# Patient Record
Sex: Female | Born: 1995 | Race: Black or African American | Hispanic: No | Marital: Single | Smoking: Current every day smoker
Health system: Southern US, Community
[De-identification: ages and names within clinical notes are randomized; demographics above are authoritative.]

## PROBLEM LIST (undated history)

## (undated) ENCOUNTER — Inpatient Hospital Stay (HOSPITAL_COMMUNITY): Payer: Self-pay

## (undated) DIAGNOSIS — Z789 Other specified health status: Secondary | ICD-10-CM

## (undated) DIAGNOSIS — J45909 Unspecified asthma, uncomplicated: Secondary | ICD-10-CM

## (undated) HISTORY — PX: NO PAST SURGERIES: SHX2092

---
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
## 2017-01-25 ENCOUNTER — Encounter: Payer: Self-pay | Admitting: Certified Nurse Midwife

## 2017-01-25 ENCOUNTER — Other Ambulatory Visit (HOSPITAL_COMMUNITY)
Admission: RE | Admit: 2017-01-25 | Discharge: 2017-01-25 | Disposition: A | Discharge status: Home / Self Care | Payer: Medicaid Other | Source: Ambulatory Visit | Attending: Certified Nurse Midwife | Admitting: Certified Nurse Midwife

## 2017-01-25 ENCOUNTER — Ambulatory Visit (INDEPENDENT_AMBULATORY_CARE_PROVIDER_SITE_OTHER): Payer: Medicaid Other | Admitting: Certified Nurse Midwife

## 2017-01-25 VITALS — BP 91/55 | HR 98 | Wt 112.0 lb

## 2017-01-25 DIAGNOSIS — O0932 Supervision of pregnancy with insufficient antenatal care, second trimester: Secondary | ICD-10-CM

## 2017-01-25 DIAGNOSIS — Z3402 Encounter for supervision of normal first pregnancy, second trimester: Secondary | ICD-10-CM | POA: Diagnosis not present

## 2017-01-25 DIAGNOSIS — Z23 Encounter for immunization: Secondary | ICD-10-CM

## 2017-01-25 DIAGNOSIS — Z3400 Encounter for supervision of normal first pregnancy, unspecified trimester: Secondary | ICD-10-CM | POA: Diagnosis not present

## 2017-01-25 DIAGNOSIS — O0930 Supervision of pregnancy with insufficient antenatal care, unspecified trimester: Secondary | ICD-10-CM

## 2017-01-25 MED ORDER — PRENATE PIXIE 10-0.6-0.4-200 MG PO CAPS: 1.0000 | ORAL_CAPSULE | Freq: Every day | ORAL | 12 refills | Status: DC

## 2017-01-26 DIAGNOSIS — O0930 Supervision of pregnancy with insufficient antenatal care, unspecified trimester: Secondary | ICD-10-CM | POA: Insufficient documentation

## 2017-01-26 LAB — OBSTETRIC PANEL, INCLUDING HIV
Antibody Screen: NEGATIVE
BASOS ABS: 0 10*3/uL (ref 0.0–0.2)
Basos: 0 %
EOS (ABSOLUTE): 0.2 10*3/uL (ref 0.0–0.4)
Eos: 2 %
HEP B S AG: NEGATIVE
HIV SCREEN 4TH GENERATION: NONREACTIVE
Hematocrit: 33.1 % — ABNORMAL LOW (ref 34.0–46.6)
Hemoglobin: 11.1 g/dL (ref 11.1–15.9)
IMMATURE GRANS (ABS): 0 10*3/uL (ref 0.0–0.1)
IMMATURE GRANULOCYTES: 0 %
LYMPHS: 20 %
Lymphocytes Absolute: 1.5 10*3/uL (ref 0.7–3.1)
MCH: 31.2 pg (ref 26.6–33.0)
MCHC: 33.5 g/dL (ref 31.5–35.7)
MCV: 93 fL (ref 79–97)
MONOCYTES: 5 %
Monocytes Absolute: 0.4 10*3/uL (ref 0.1–0.9)
NEUTROS ABS: 5.3 10*3/uL (ref 1.4–7.0)
Neutrophils: 73 %
PLATELETS: 238 10*3/uL (ref 150–379)
RBC: 3.56 x10E6/uL — ABNORMAL LOW (ref 3.77–5.28)
RDW: 14.1 % (ref 12.3–15.4)
RPR: NONREACTIVE
RUBELLA: 1.04 {index} (ref >0.99)
Rh Factor: POSITIVE
WBC: 7.4 10*3/uL (ref 3.4–10.8)

## 2017-01-26 LAB — TSH PREGNANCY: TSH PREGNANCY: 0.372 u[IU]/mL — AB (ref 0.450–4.500)

## 2017-01-26 LAB — CERVICOVAGINAL ANCILLARY ONLY
BACTERIAL VAGINITIS: NEGATIVE
CANDIDA VAGINITIS: POSITIVE — AB
CHLAMYDIA, DNA PROBE: NEGATIVE
NEISSERIA GONORRHEA: NEGATIVE
TRICH (WINDOWPATH): NEGATIVE

## 2017-01-26 LAB — HEMOGLOBIN A1C
ESTIMATED AVERAGE GLUCOSE: 88 mg/dL
HEMOGLOBIN A1C: 4.7 % — AB (ref 4.8–5.6)

## 2017-01-26 LAB — VARICELLA ZOSTER ANTIBODY, IGG: Varicella zoster IgG: <135 index — ABNORMAL LOW (ref >165)

## 2017-01-26 NOTE — Progress Notes (Signed)
Subjective:    Teresa Johnson is being seen today for her first obstetrical visit.  This is not a planned pregnancy. She is at 2575w0d gestation. Her obstetrical history is significant for none. Relationship with FOB: significant other, living together. Patient does intend to breast feed. Pregnancy history fully reviewed.  Works at [employer] currently.    The information documented in the HPI was reviewed and verified.  Menstrual History: OB History    Gravida Para Term Preterm AB Living   1         0   SAB TAB Ectopic Multiple Live Births                   Patient's last menstrual period was 10/06/2016.    Past Medical History:  Diagnosis Date  . Medical history non-contributory     Past Surgical History:  Procedure Laterality Date  . NO PAST SURGERIES       (Not in a hospital admission) No Known Allergies  Social History  Substance Use Topics  . Smoking status: Never Smoker  . Smokeless tobacco: Never Used  . Alcohol use No    Family History  Problem Relation Age of Onset  . Diabetes Mother      Review of Systems Constitutional: negative for weight loss Gastrointestinal: negative for vomiting Genitourinary:negative for genital lesions and vaginal discharge and dysuria Musculoskeletal:negative for back pain Behavioral/Psych: negative for abusive relationship, depression, illegal drug usage and tobacco use    Objective:    BP (!) 91/55   Pulse 98   Wt 112 lb (50.8 kg)   LMP 10/06/2016   BMI 20.49 kg/m  General Appearance:    Alert, cooperative, no distress, appears stated age  Head:    Normocephalic, without obvious abnormality, atraumatic  Eyes:    PERRL, conjunctiva/corneas clear, EOM's intact, fundi    benign, both eyes  Ears:    Normal TM's and external ear canals, both ears  Nose:   Nares normal, septum midline, mucosa normal, no drainage    or sinus tenderness  Throat:   Lips, mucosa, and tongue normal; teeth and gums normal  Neck:   Supple,  symmetrical, trachea midline, no adenopathy;    thyroid:  no enlargement/tenderness/nodules; no carotid   bruit or JVD  Back:     Symmetric, no curvature, ROM normal, no CVA tenderness  Lungs:     Clear to auscultation bilaterally, respirations unlabored  Chest Wall:    No tenderness or deformity   Heart:    Regular rate and rhythm, S1 and S2 normal, no murmur, rub   or gallop  Breast Exam:    No tenderness, masses, or nipple abnormality  Abdomen:     Soft, non-tender, bowel sounds active all four quadrants,    no masses, no organomegaly  Genitalia:    Normal female without lesion, discharge or tenderness  Extremities:   Extremities normal, atraumatic, no cyanosis or edema  Pulses:   2+ and symmetric all extremities  Skin:   Skin color, texture, turgor normal, no rashes or lesions  Lymph nodes:   Cervical, supraclavicular, and axillary nodes normal  Neurologic:   CNII-XII intact, normal strength, sensation and reflexes    throughout       Cervix:  Long, thick, closed and posterior.  FHR:    By doppler.  FH: c/w early US.    Lab Review Urine pregnancy test Labs reviewed yes Radiologic studies reviewed yes  Assessment & Plan    Pregnancy  at [redacted]w[redacted]d weeks    1. Supervision of normal first pregnancy, antepartum    - [place] MFM OB COMP + 14 WK; Future - Varicella zoster antibody, IgG - Culture, OB Urine - MaterniT21 PLUS Core+SCA - Hemoglobin A1c - Obstetric Panel, Including HIV - TSH Pregnancy - Inheritest Society Guided - Prenat-FeAsp-Meth-FA-DHA w/o A (PRENATE PIXIE) 10-0.6-0.4-200 MG CAPS; Take 1 tablet by mouth daily.  Dispense: 30 capsule; Refill: 12 - Cervicovaginal ancillary only - AFP, Serum, Open Spina Bifida  2. Flu vaccine need    - Flu Vaccine QUAD 36+ mos IM (Fluarix, Quad PF)     Prenatal vitamins.  Counseling provided regarding continued use of seat belts, cessation of alcohol consumption, smoking or use of illicit drugs; infection precautions i.e.,  influenza/TDAP immunizations, toxoplasmosis,CMV, parvovirus, listeria and varicella; workplace safety, exercise during pregnancy; routine dental care, safe medications, sexual activity, hot tubs, saunas, pools, travel, caffeine use, fish and methlymercury, potential toxins, hair treatments, varicose veins Weight gain recommendations per IOM guidelines reviewed: underweight/BMI< 18.5--> gain 28 - 40 lbs; normal weight/BMI 18.5 - 24.9--> gain 25 - 35 lbs; overweight/BMI 25 - 29.9--> gain 15 - 25 lbs; obese/BMI >30->gain  11 - 20 lbs Problem list reviewed and updated. FIRST/CF mutation testing/NIPT/QUAD SCREEN/fragile X/Ashkenazi Jewish population testing/Spinal muscular atrophy discussed: ordered. Role of ultrasound in pregnancy discussed; fetal survey: ordered. Amniocentesis discussed: not indicated.   Meds ordered this encounter  Medications  . Prenat-FeAsp-Meth-FA-DHA w/o A (PRENATE PIXIE) 10-0.6-0.4-200 MG CAPS    Sig: Take 1 tablet by mouth daily.    Dispense:  30 capsule    Refill:  12    Please process coupon: Rx BIN: [id number], RxPCN: [id number], RxGRP: [id number], RxID: [id number]  SUF: 01   Orders Placed This Encounter  Procedures  . Culture, OB Urine  . [place] MFM OB COMP + 14 WK    Standing Status:   Future    Standing Expiration Date:   03/27/2018    Order Specific Question:   Reason for Exam (SYMPTOM  OR DIAGNOSIS REQUIRED)    Answer:   fetal anatomy scan    Order Specific Question:   Preferred imaging location?    Answer:   [facility]  . Flu Vaccine QUAD 36+ mos IM (Fluarix, Quad PF)  . Varicella zoster antibody, IgG  . MaterniT21 PLUS Core+SCA    Order Specific Question:   Is the patient insulin dependent?    Answer:   No    Order Specific Question:   Please enter gestational age. This should be expressed as weeks AND days, i.e. 16w 6d. Enter weeks here. Enter days in next question.    Answer:   44    Order Specific Question:   Please enter gestational age. This should be  expressed as weeks AND days, i.e. 16w 6d. Enter days here. Enter weeks in previous question.    Answer:   6    Order Specific Question:   How was gestational age calculated?    Answer:   LMP    Order Specific Question:   Please give the date of LMP OR Ultrasound OR Estimated date of delivery.    Answer:   07/13/2017    Order Specific Question:   Number of Fetuses (Type of Pregnancy):    Answer:   1    Order Specific Question:   Indications for performing the test? (please choose all that apply):    Answer:   Routine screening    Order Specific Question:   Other  Indications? (Y=Yes, N=No)    Answer:   N    Order Specific Question:   If this is a repeat specimen, please indicate the reason:    Answer:   Not indicated    Order Specific Question:   Please specify the patient's race: (C=White/Caucasion, B=Black, I=Native American, A=Asian, H=Hispanic, O=Other, U=Unknown)    Answer:   B    Order Specific Question:   Donor Egg - indicate if the egg was obtained from in vitro fertilization.    Answer:   N    Order Specific Question:   Age of Egg Donor.    Answer:   46    Order Specific Question:   Prior Down Syndrome/ONTD screening during current pregnancy.    Answer:   N    Order Specific Question:   Prior First Trimester Testing    Answer:   N    Order Specific Question:   Prior Second Trimester Testing    Answer:   N    Order Specific Question:   Family History of Neural Tube Defects    Answer:   N    Order Specific Question:   Prior Pregnancy with Down Syndrome    Answer:   N    Order Specific Question:   Please give the patient's weight (in pounds)    Answer:   112  . Hemoglobin A1c  . Obstetric Panel, Including HIV  . TSH Pregnancy  . Inheritest Society Guided  . AFP, Serum, Open Spina Bifida    Order Specific Question:   Is patient insulin dependent?    Answer:   No    Order Specific Question:   Weight (lbs)    Answer:   63    Order Specific Question:   Gestational Age (GA),  weeks    Answer:   15.6    Order Specific Question:   Date on which patient was at this GA    Answer:   01/25/2017    Order Specific Question:   GA Calculation Method    Answer:   Ultrasound    Order Specific Question:   GA Date    Answer:   07/13/2017    Order Specific Question:   Number of fetuses    Answer:   1    Order Specific Question:   Reason for screen    Answer:   PDOWNS    Order Specific Question:   Donor egg?    Answer:   N    Order Specific Question:   Age of egg donor?    Answer:   21    Follow up in 4 weeks. 50% of 30 min visit spent on counseling and coordination of care.

## 2017-01-27 LAB — CULTURE, OB URINE

## 2017-01-27 LAB — URINE CULTURE, OB REFLEX

## 2017-01-28 LAB — AFP, SERUM, OPEN SPINA BIFIDA
AFP MoM: 0.65
AFP Value: 27 ng/mL
Gest. Age on Collection Date: 15.6 weeks
Maternal Age At EDD: 20 yr
OSBR RISK 1 IN: 10000
TEST RESULTS AFP: NEGATIVE
WEIGHT: 112 [lb_av]

## 2017-01-29 LAB — MATERNIT21 PLUS CORE+SCA
CHROMOSOME 13: NEGATIVE
CHROMOSOME 18: NEGATIVE
CHROMOSOME 21: NEGATIVE
Y Chromosome: NOT DETECTED

## 2017-02-04 ENCOUNTER — Other Ambulatory Visit: Payer: Self-pay | Admitting: Certified Nurse Midwife

## 2017-02-04 DIAGNOSIS — B373 Candidiasis of vulva and vagina: Secondary | ICD-10-CM

## 2017-02-04 DIAGNOSIS — Z2839 Other underimmunization status: Secondary | ICD-10-CM

## 2017-02-04 DIAGNOSIS — O09899 Supervision of other high risk pregnancies, unspecified trimester: Secondary | ICD-10-CM

## 2017-02-04 DIAGNOSIS — Z283 Underimmunization status: Secondary | ICD-10-CM

## 2017-02-04 DIAGNOSIS — B3731 Acute candidiasis of vulva and vagina: Secondary | ICD-10-CM

## 2017-02-04 DIAGNOSIS — Z3400 Encounter for supervision of normal first pregnancy, unspecified trimester: Secondary | ICD-10-CM

## 2017-02-04 MED ORDER — TERCONAZOLE 0.8 % VA CREA: 1.0000 | TOPICAL_CREAM | Freq: Every day | VAGINAL | 0 refills | Status: DC

## 2017-02-04 MED ORDER — FLUCONAZOLE 150 MG PO TABS: 150.0000 mg | ORAL_TABLET | Freq: Once | ORAL | 0 refills | Status: AC

## 2017-02-06 LAB — INHERITEST SOCIETY GUIDED

## 2017-02-07 ENCOUNTER — Other Ambulatory Visit: Payer: Self-pay | Admitting: Certified Nurse Midwife

## 2017-02-07 DIAGNOSIS — Z3400 Encounter for supervision of normal first pregnancy, unspecified trimester: Secondary | ICD-10-CM

## 2017-02-22 ENCOUNTER — Ambulatory Visit (INDEPENDENT_AMBULATORY_CARE_PROVIDER_SITE_OTHER): Payer: Medicaid Other | Admitting: Certified Nurse Midwife

## 2017-02-22 ENCOUNTER — Encounter: Payer: Self-pay | Admitting: Certified Nurse Midwife

## 2017-02-22 ENCOUNTER — Ambulatory Visit (HOSPITAL_COMMUNITY)
Admission: RE | Admit: 2017-02-22 | Discharge: 2017-02-22 | Disposition: A | Discharge status: Home / Self Care | Payer: Medicaid Other | Source: Ambulatory Visit | Attending: Certified Nurse Midwife | Admitting: Certified Nurse Midwife

## 2017-02-22 ENCOUNTER — Ambulatory Visit (HOSPITAL_COMMUNITY): Payer: No Typology Code available for payment source

## 2017-02-22 VITALS — BP 101/67 | HR 86 | Wt 120.0 lb

## 2017-02-22 DIAGNOSIS — Z3402 Encounter for supervision of normal first pregnancy, second trimester: Secondary | ICD-10-CM

## 2017-02-22 DIAGNOSIS — Z3400 Encounter for supervision of normal first pregnancy, unspecified trimester: Secondary | ICD-10-CM

## 2017-02-22 DIAGNOSIS — Z3689 Encounter for other specified antenatal screening: Secondary | ICD-10-CM | POA: Insufficient documentation

## 2017-02-22 DIAGNOSIS — O09899 Supervision of other high risk pregnancies, unspecified trimester: Secondary | ICD-10-CM

## 2017-02-22 DIAGNOSIS — Z2839 Other underimmunization status: Secondary | ICD-10-CM

## 2017-02-22 DIAGNOSIS — Z283 Underimmunization status: Secondary | ICD-10-CM

## 2017-02-22 DIAGNOSIS — Z3A19 19 weeks gestation of pregnancy: Secondary | ICD-10-CM | POA: Diagnosis not present

## 2017-02-22 NOTE — Progress Notes (Signed)
Pt has had some cramping, sent her home from work x 1 occurrence.

## 2017-02-22 NOTE — Progress Notes (Signed)
   PRENATAL VISIT NOTE  Subjective:  Teresa Johnson is a 21 y.o. G1P0 at [redacted]w[redacted]d being seen today for ongoing prenatal care.  She is currently monitored for the following issues for this low-risk pregnancy and has Supervision of normal first pregnancy, antepartum; Late prenatal care; and Maternal varicella, non-immune on her problem list.  Patient reports no complaints.  Contractions: Irritability. Vag. Bleeding: None.  Movement: Present. Denies leaking of fluid.   The following portions of the patient's history were reviewed and updated as appropriate: allergies, current medications, past family history, past medical history, past social history, past surgical history and problem list. Problem list updated.  Objective:   Vitals:   02/22/17 1024  BP: 101/67  Pulse: 86  Weight: 120 lb (54.4 kg)    Fetal Status: Fetal Heart Rate (bpm): 142; doppler Fundal Height: 19 cm Movement: Present     General:  Alert, oriented and cooperative. Patient is in no acute distress.  Skin: Skin is warm and dry. No rash noted.   Cardiovascular: Normal heart rate noted  Respiratory: Normal respiratory effort, no problems with respiration noted  Abdomen: Soft, gravid, appropriate for gestational age.  Pain/Pressure: Absent     Pelvic: Cervical exam deferred        Extremities: Normal range of motion.     Mental Status:  Normal mood and affect. Normal behavior. Normal judgment and thought content.   Assessment and Plan:  Pregnancy: G1P0 at [redacted]w[redacted]d  1. Supervision of normal first pregnancy, antepartum      Doing well.   2. Maternal varicella, non-immune     Varicella postpartum.   Preterm labor symptoms and general obstetric precautions including but not limited to vaginal bleeding, contractions, leaking of fluid and fetal movement were reviewed in detail with the patient. Please refer to After Visit Summary for other counseling recommendations.  Return in about 4 weeks (around 03/22/2017) for  ROB.   Roe Coombs, CNM

## 2017-02-23 ENCOUNTER — Other Ambulatory Visit: Payer: Self-pay | Admitting: Certified Nurse Midwife

## 2017-03-22 ENCOUNTER — Other Ambulatory Visit (HOSPITAL_COMMUNITY)
Admission: RE | Admit: 2017-03-22 | Discharge: 2017-03-22 | Disposition: A | Discharge status: Home / Self Care | Payer: Medicaid Other | Source: Ambulatory Visit | Attending: Certified Nurse Midwife | Admitting: Certified Nurse Midwife

## 2017-03-22 ENCOUNTER — Ambulatory Visit (INDEPENDENT_AMBULATORY_CARE_PROVIDER_SITE_OTHER): Payer: Medicaid Other | Admitting: Certified Nurse Midwife

## 2017-03-22 ENCOUNTER — Encounter: Payer: Self-pay | Admitting: Certified Nurse Midwife

## 2017-03-22 VITALS — BP 90/57 | HR 95 | Wt 120.8 lb

## 2017-03-22 DIAGNOSIS — N898 Other specified noninflammatory disorders of vagina: Secondary | ICD-10-CM

## 2017-03-22 DIAGNOSIS — O219 Vomiting of pregnancy, unspecified: Secondary | ICD-10-CM

## 2017-03-22 DIAGNOSIS — Z2839 Other underimmunization status: Secondary | ICD-10-CM

## 2017-03-22 DIAGNOSIS — O23592 Infection of other part of genital tract in pregnancy, second trimester: Secondary | ICD-10-CM | POA: Insufficient documentation

## 2017-03-22 DIAGNOSIS — O09899 Supervision of other high risk pregnancies, unspecified trimester: Secondary | ICD-10-CM

## 2017-03-22 DIAGNOSIS — B3731 Acute candidiasis of vulva and vagina: Secondary | ICD-10-CM

## 2017-03-22 DIAGNOSIS — B373 Candidiasis of vulva and vagina: Secondary | ICD-10-CM

## 2017-03-22 DIAGNOSIS — Z3A23 23 weeks gestation of pregnancy: Secondary | ICD-10-CM | POA: Insufficient documentation

## 2017-03-22 DIAGNOSIS — Z283 Underimmunization status: Secondary | ICD-10-CM

## 2017-03-22 DIAGNOSIS — Z3400 Encounter for supervision of normal first pregnancy, unspecified trimester: Secondary | ICD-10-CM

## 2017-03-22 MED ORDER — TERCONAZOLE 0.8 % VA CREA: 1.0000 | TOPICAL_CREAM | Freq: Every day | VAGINAL | 0 refills | Status: DC

## 2017-03-22 MED ORDER — ONDANSETRON HCL 8 MG PO TABS: 8.0000 mg | ORAL_TABLET | Freq: Three times a day (TID) | ORAL | 2 refills | Status: DC | PRN

## 2017-03-22 MED ORDER — FLUCONAZOLE 150 MG PO TABS: 150.0000 mg | ORAL_TABLET | Freq: Once | ORAL | 0 refills | Status: AC

## 2017-03-22 NOTE — Progress Notes (Signed)
   PRENATAL VISIT NOTE  Subjective:  Teresa Johnson is a 21 y.o. G1P0 at 1243w6d being seen today for ongoing prenatal care.  She is currently monitored for the following issues for this low-risk pregnancy and has Supervision of normal first pregnancy, antepartum; Late prenatal care; and Maternal varicella, non-immune on her problem list.  Patient reports nausea, no bleeding, no contractions, no cramping, no leaking and vaginal irritation.  Contractions: Not present. Vag. Bleeding: None.  Movement: Present. Denies leaking of fluid.   The following portions of the patient's history were reviewed and updated as appropriate: allergies, current medications, past family history, past medical history, past social history, past surgical history and problem list. Problem list updated.  Objective:   Vitals:   03/22/17 0855  BP: (!) 90/57  Pulse: 95  Weight: 120 lb 12.8 oz (54.8 kg)    Fetal Status: Fetal Heart Rate (bpm): 148; doppler Fundal Height: 22 cm Movement: Present     General:  Alert, oriented and cooperative. Patient is in no acute distress.  Skin: Skin is warm and dry. No rash noted.   Cardiovascular: Normal heart rate noted  Respiratory: Normal respiratory effort, no problems with respiration noted  Abdomen: Soft, gravid, appropriate for gestational age.  Pain/Pressure: Absent     Pelvic: Cervical exam deferred        Extremities: Normal range of motion.  Edema: Trace  Mental Status:  Normal mood and affect. Normal behavior. Normal judgment and thought content.   Assessment and Plan:  Pregnancy: G1P0 at 6043w6d  1. Supervision of normal first pregnancy, antepartum     - Cervicovaginal ancillary only  2. Maternal varicella, non-immune     Varicella postpartum  3. Vaginal discharge      +yeast on exam - Cervicovaginal ancillary only  4. Yeast vaginitis     - fluconazole (DIFLUCAN) 150 MG tablet; Take 1 tablet (150 mg total) by mouth once.  Dispense: 1 tablet; Refill: 0 -  terconazole (TERAZOL 3) 0.8 % vaginal cream; Place 1 applicator vaginally at bedtime.  Dispense: 20 g; Refill: 0  5. Nausea/vomiting in pregnancy     - ondansetron (ZOFRAN) 8 MG tablet; Take 1 tablet (8 mg total) by mouth every 8 (eight) hours as needed for nausea or vomiting.  Dispense: 40 tablet; Refill: 2  Preterm labor symptoms and general obstetric precautions including but not limited to vaginal bleeding, contractions, leaking of fluid and fetal movement were reviewed in detail with the patient. Please refer to After Visit Summary for other counseling recommendations.  Return in about 4 weeks (around 04/19/2017) for ROB, 2 hr OGTT.   Roe Coombsachelle A Dinna Severs, CNM

## 2017-03-22 NOTE — Progress Notes (Signed)
Complains of NV, wants Rx.  Discharge, itching, no odor or burning.

## 2017-03-23 LAB — CERVICOVAGINAL ANCILLARY ONLY
BACTERIAL VAGINITIS: NEGATIVE
CHLAMYDIA, DNA PROBE: NEGATIVE
Candida vaginitis: POSITIVE — AB
NEISSERIA GONORRHEA: NEGATIVE
Trichomonas: NEGATIVE

## 2017-03-24 ENCOUNTER — Other Ambulatory Visit: Payer: Self-pay | Admitting: Certified Nurse Midwife

## 2017-03-27 ENCOUNTER — Telehealth: Payer: Self-pay | Admitting: *Deleted

## 2017-03-27 NOTE — Telephone Encounter (Signed)
Pt called to office stating she had a cramp in her leg last night and today could not walk on it once she woke up, stating it was "locked up". Pt ask what she should do about leg cramps.  Pt advised to stay well hydrated, may try Tylenol, rest leg today and given comfort measures. Pt advised to put weight on leg as tolerated.  Pt made aware message to be sent to provider for further recommendations.  Please advise.

## 2017-04-19 ENCOUNTER — Encounter: Payer: Self-pay | Admitting: Certified Nurse Midwife

## 2017-04-19 ENCOUNTER — Ambulatory Visit (INDEPENDENT_AMBULATORY_CARE_PROVIDER_SITE_OTHER): Payer: Medicaid Other | Admitting: Certified Nurse Midwife

## 2017-04-19 ENCOUNTER — Other Ambulatory Visit: Payer: Medicaid Other

## 2017-04-19 VITALS — BP 103/70 | HR 98 | Wt 127.2 lb

## 2017-04-19 DIAGNOSIS — Z2839 Other underimmunization status: Secondary | ICD-10-CM

## 2017-04-19 DIAGNOSIS — O09892 Supervision of other high risk pregnancies, second trimester: Secondary | ICD-10-CM

## 2017-04-19 DIAGNOSIS — O0930 Supervision of pregnancy with insufficient antenatal care, unspecified trimester: Secondary | ICD-10-CM

## 2017-04-19 DIAGNOSIS — Z283 Underimmunization status: Secondary | ICD-10-CM

## 2017-04-19 DIAGNOSIS — Z3400 Encounter for supervision of normal first pregnancy, unspecified trimester: Secondary | ICD-10-CM

## 2017-04-19 DIAGNOSIS — Z3402 Encounter for supervision of normal first pregnancy, second trimester: Secondary | ICD-10-CM

## 2017-04-19 DIAGNOSIS — O09899 Supervision of other high risk pregnancies, unspecified trimester: Secondary | ICD-10-CM

## 2017-04-19 DIAGNOSIS — O0932 Supervision of pregnancy with insufficient antenatal care, second trimester: Secondary | ICD-10-CM

## 2017-04-19 NOTE — Progress Notes (Signed)
Pt c/o cold sx

## 2017-04-19 NOTE — Patient Instructions (Addendum)
AREA PED[facility]ATRIC/FAMILY [place] [address] [place], [place]  [address] Phone - [phone]   Fax - [phone]  [facility] [address] [place], [place] [address] Phone - [phone]   Fax - [place] [address] [place], [place]  [address] Phone - [phone]   Fax - [phone]  [place] [address] [place] [address] [place], [place]  [address] Phone - [phone]   Fax - [phone]  [place] [address] [place], [place]  [address] Phone - [phone]   Fax - [phone]  [facility] [address] [place], [place]  [address] Phone - [phone]   Fax - [place] [address] [place], [place]  [address] Phone - [phone]   Fax - [phone]  [place] [address] [place], [place] [address] [place], [place]  [address] Phone - [phone]   Fax - [place] [address] [place], [place]  [address] Phone - [phone]   Fax - [phone] [facility] [place]. [address] [place], [place]  [address] Phone - [phone]   Fax - [phone]  [place] [address] [place], [place]  [address] Phone - [phone]   Fax - [phone]  [facility] [place], [place], [place]  [address] Phone - [phone]   Fax - [place] [address] [place], [place]  [address] Phone - [phone]   Fax - [phone]  [facility] [address] [place], [place]  [address] Phone - [place], [place]  [address] Phone - [phone]   Fax - [place] [address], [place], [place]  [address] Phone - [phone]   Fax - [phone]  [place] [address] [place], [place]  [address] Phone - [phone]   Fax - [place]. [place], [place]  [address] Phone - [phone]   Fax - [place], [place], [place]  [address] Phone - [phone]   Fax - [place] [address] [place], [place]  [address] Phone - [phone]   Fax - [phone]  [facility] [address] [place], [place]  [address] Phone - [phone]   Fax - [place] [place], [place]  [address] Phone - [phone]   Fax - [phone]  [place] [address] [place], [place]  [address] Phone - [phone]   Fax - [phone] [place] [address] [place],   [address] Phone - [phone]   Fax - [place] [address] [place],   [address] Phone - [phone]   Fax - [place] [address], [place],   [address] Phone - [phone]   Fax - [phone]  [place] [address] [place] [address] Phone [phone]  Fax [phone]  Contraception Choices Contraception (birth control) is the use of any methods or devices to prevent  pregnancy. Below are some methods to help avoid pregnancy. Hormonal methods  Contraceptive implant. This is a thin, plastic tube containing progesterone hormone. It does not contain estrogen hormone. Your health care provider inserts the tube in the inner part of the upper arm. The tube can remain in place for up to 3 years. After 3 years, the implant must be removed. The implant prevents the  ovaries from releasing an egg (ovulation), thickens the cervical mucus to prevent sperm from entering the uterus, and thins the lining of the inside of the uterus.  Progesterone-only injections. These injections are given every 3 months by your health care provider to prevent pregnancy. This synthetic progesterone hormone stops the ovaries from releasing eggs. It also thickens cervical mucus and changes the uterine lining. This makes it harder for sperm to survive in the uterus.  Birth control pills. These pills contain estrogen and progesterone hormone. They work by preventing the ovaries from releasing eggs (ovulation). They also cause the cervical mucus to thicken, preventing the sperm from entering the uterus. Birth control pills are prescribed by a health care provider.Birth control pills can also be used to treat heavy periods.  Minipill. This type of birth control pill contains only the progesterone hormone. They are taken every day of each month and must be prescribed by your health care provider.  Birth control patch. The patch contains hormones similar to those in birth control pills. It must be changed once a week and is prescribed by a health care provider.  Vaginal ring. The ring contains hormones similar to those in birth control pills. It is left in the vagina for 3 weeks, removed for 1 week, and then a new one is put back in place. The patient must be comfortable inserting and removing the ring from the vagina.A health care provider's prescription is necessary.  Emergency contraception. Emergency contraceptives prevent pregnancy after unprotected sexual intercourse. This pill can be taken right after sex or up to 5 days after unprotected sex. It is most effective the sooner you take the pills after having sexual intercourse. Most emergency contraceptive pills are available without a prescription. Check with your pharmacist. Do not use emergency contraception as your only form of birth  control. Barrier methods  Female condom. This is a thin sheath (latex or rubber) that is worn over the penis during sexual intercourse. It can be used with spermicide to increase effectiveness.  Female condom. This is a soft, loose-fitting sheath that is put into the vagina before sexual intercourse.  Diaphragm. This is a soft, latex, dome-shaped barrier that must be fitted by a health care provider. It is inserted into the vagina, along with a spermicidal jelly. It is inserted before intercourse. The diaphragm should be left in the vagina for 6 to 8 hours after intercourse.  Cervical cap. This is a round, soft, latex or plastic cup that fits over the cervix and must be fitted by a health care provider. The cap can be left in place for up to 48 hours after intercourse.  Sponge. This is a soft, circular piece of polyurethane foam. The sponge has spermicide in it. It is inserted into the vagina after wetting it and before sexual intercourse.  Spermicides. These are chemicals that kill or block sperm from entering the cervix and uterus. They come in the form of creams, jellies, suppositories, foam, or tablets. They do not require a prescription. They are inserted into the vagina with an applicator before having sexual intercourse. The process   must be repeated every time you have sexual intercourse. Intrauterine contraception  Intrauterine device (IUD). This is a T-shaped device that is put in a woman's uterus during a menstrual period to prevent pregnancy. There are 2 types: ? Copper IUD. This type of IUD is wrapped in copper wire and is placed inside the uterus. Copper makes the uterus and fallopian tubes produce a fluid that kills sperm. It can stay in place for 10 years. ? Hormone IUD. This type of IUD contains the hormone progestin (synthetic progesterone). The hormone thickens the cervical mucus and prevents sperm from entering the uterus, and it also thins the uterine lining to prevent  implantation of a fertilized egg. The hormone can weaken or kill the sperm that get into the uterus. It can stay in place for 3-5 years, depending on which type of IUD is used. Permanent methods of contraception  Female tubal ligation. This is when the woman's fallopian tubes are surgically sealed, tied, or blocked to prevent the egg from traveling to the uterus.  Hysteroscopic sterilization. This involves placing a small coil or insert into each fallopian tube. Your doctor uses a technique called hysteroscopy to do the procedure. The device causes scar tissue to form. This results in permanent blockage of the fallopian tubes, so the sperm cannot fertilize the egg. It takes about 3 months after the procedure for the tubes to become blocked. You must use another form of birth control for these 3 months.  Female sterilization. This is when the female has the tubes that carry sperm tied off (vasectomy).This blocks sperm from entering the vagina during sexual intercourse. After the procedure, the man can still ejaculate fluid (semen). Natural planning methods  Natural family planning. This is not having sexual intercourse or using a barrier method (condom, diaphragm, cervical cap) on days the woman could become pregnant.  Calendar method. This is keeping track of the length of each menstrual cycle and identifying when you are fertile.  Ovulation method. This is avoiding sexual intercourse during ovulation.  Symptothermal method. This is avoiding sexual intercourse during ovulation, using a thermometer and ovulation symptoms.  Post-ovulation method. This is timing sexual intercourse after you have ovulated. Regardless of which type or method of contraception you choose, it is important that you use condoms to protect against the transmission of sexually transmitted infections (STIs). Talk with your health care provider about which form of contraception is most appropriate for you. This information is not  intended to replace advice given to you by your health care provider. Make sure you discuss any questions you have with your health care provider. Document Released: 05/09/2005 Document Revised: 10/15/2015 Document Reviewed: 11/01/2012 Elsevier Interactive Patient Education  2017 [employer]  Third Trimester of Pregnancy The third trimester is from week 29 through week 42, months 7 through 9. This trimester is when your unborn baby (fetus) is growing very fast. At the end of the ninth month, the unborn baby is about 20 inches in length. It weighs about 6-10 pounds. Follow these instructions at home:  Avoid all smoking, herbs, and alcohol. Avoid drugs not approved by your doctor.  Do not use any tobacco products, including cigarettes, chewing tobacco, and electronic cigarettes. If you need help quitting, ask your doctor. You may get counseling or other support to help you quit.  Only take medicine as told by your doctor. Some medicines are safe and some are not during pregnancy.  Exercise only as told by your doctor. Stop exercising if you  start having cramps.  Eat regular, healthy meals.  Wear a good support bra if your breasts are tender.  Do not use hot tubs, steam rooms, or saunas.  Wear your seat belt when driving.  Avoid raw meat, uncooked cheese, and liter boxes and soil used by cats.  Take your prenatal vitamins.  Take 1500-2000 milligrams of calcium daily starting at the 20th week of pregnancy until you deliver your baby.  Try taking medicine that helps you poop (stool softener) as needed, and if your doctor approves. Eat more fiber by eating fresh fruit, vegetables, and whole grains. Drink enough fluids to keep your pee (urine) clear or pale yellow.  Take warm water baths (sitz baths) to soothe pain or discomfort caused by hemorrhoids. Use hemorrhoid cream if your doctor approves.  If you have puffy, bulging veins (varicose veins), wear support hose. Raise (elevate) your  feet for 15 minutes, 3-4 times a day. Limit salt in your diet.  Avoid heavy lifting, wear low heels, and sit up straight.  Rest with your legs raised if you have leg cramps or low back pain.  Visit your dentist if you have not gone during your pregnancy. Use a soft toothbrush to brush your teeth. Be gentle when you floss.  You can have sex (intercourse) unless your doctor tells you not to.  Do not travel far distances unless you must. Only do so with your doctor's approval.  Take prenatal classes.  Practice driving to the hospital.  Pack your hospital bag.  Prepare the baby's room.  Go to your doctor visits. Get help if:  You are not sure if you are in labor or if your water has broken.  You are dizzy.  You have mild cramps or pressure in your lower belly (abdominal).  You have a nagging pain in your belly area.  You continue to feel sick to your stomach (nauseous), throw up (vomit), or have watery poop (diarrhea).  You have bad smelling fluid coming from your vagina.  You have pain with peeing (urination). Get help right away if:  You have a fever.  You are leaking fluid from your vagina.  You are spotting or bleeding from your vagina.  You have severe belly cramping or pain.  You lose or gain weight rapidly.  You have trouble catching your breath and have chest pain.  You notice sudden or extreme puffiness (swelling) of your face, hands, ankles, feet, or legs.  You have not felt the baby move in over an hour.  You have severe headaches that do not go away with medicine.  You have vision changes. This information is not intended to replace advice given to you by your health care provider. Make sure you discuss any questions you have with your health care provider. Document Released: 08/03/2009 Document Revised: 10/15/2015 Document Reviewed: 07/10/2012 Elsevier Interactive Patient Education  2017 [employer].  Before Baby Comes Home Before your baby  arrives it is important to:  Have all of the supplies that you will need to care for your baby.  Know where to go if there is an emergency.  Discuss the baby's arrival with other family members.  What supplies will I need?  It is recommended that you have the following supplies: Large Items  Crib.  Crib mattress.  Rear-facing infant car seat. If possible, have a trained professional check to make sure that it is installed correctly.  Feeding  6-8 bottles that are 4-5 oz in size.  6-8 nipples.  Bottle brush.  Sterilizer, or a large pan or kettle with a lid.  A way to boil and cool water.  If you will be breastfeeding: ? Breast pump. ? Nipple cream. ? Nursing bra. ? Breast pads. ? Breast shields.  If you will be formula feeding: ? Formula. ? Measuring cups. ? Measuring spoons.  Bathing  Mild baby soap and baby shampoo.  Petroleum jelly.  Soft cloth towel and washcloth.  Hooded towel.  Cotton balls.  Bath basin.  Other Supplies  Rectal thermometer.  Bulb syringe.  Baby wipes or washcloths for diaper changes.  Diaper bag.  Changing pad.  Clothing, including one-piece outfits and pajamas.  Baby nail clippers.  Receiving blankets.  Mattress pad and sheets for the crib.  Night-light for the baby's room.  Baby monitor.  2 or 3 pacifiers.  Either 24-36 cloth diapers and waterproof diaper covers or a box of disposable diapers. You may need to use as many as 10-12 diapers per day.  How do I prepare for an emergency? Prepare for an emergency by:  Knowing how to get to the nearest hospital.  Listing the phone numbers of your baby's health care providers near your home phone and in your cell phone.  How do I prepare my family?  Decide how to handle visitors.  If you have other children: ? Talk with them about the baby coming home. Ask them how they feel about it. ? Read a book together about being a new big brother or sister. ? Find  ways to let them help you prepare for the new baby. ? Have someone ready to care for them while you are in the hospital. This information is not intended to replace advice given to you by your health care provider. Make sure you discuss any questions you have with your health care provider. Document Released: 04/21/2008 Document Revised: 10/15/2015 Document Reviewed: 04/16/2014 Elsevier Interactive Patient Education  [employer]2018 [employer] Inc[employer]

## 2017-04-19 NOTE — Progress Notes (Signed)
   PRENATAL VISIT NOTE  Subjective:  Teresa Johnson is a 21 y.o. G1P0 at 2380w6d being seen today for ongoing prenatal care.  She is currently monitored for the following issues for this low-risk pregnancy and has Supervision of normal first pregnancy, antepartum; Late prenatal care; and Maternal varicella, non-immune on their problem list.  Patient reports no bleeding, no contractions, no cramping, no leaking and N&V with 2 hour OGTT, testing stopped.  Desires to quit working.  Discussed options.Marland Kitchen. URI symptoms, denies fever, reports nasal congestion, cough, no sputum production  Contractions: Not present. Vag. Bleeding: None.  Movement: Present. Denies leaking of fluid.   The following portions of the patient's history were reviewed and updated as appropriate: allergies, current medications, past family history, past medical history, past social history, past surgical history and problem list. Problem list updated.  Objective:   Vitals:   04/19/17 0919  BP: 103/70  Pulse: 98  Weight: 127 lb 3.2 oz (57.7 kg)    Fetal Status:     Movement: Present     General:  Alert, oriented and cooperative. Patient is in no acute distress.  Skin: Skin is warm and dry. No rash noted.   Cardiovascular: Normal heart rate noted  Respiratory: Normal respiratory effort, no problems with respiration noted  Abdomen: Soft, gravid, appropriate for gestational age.  Pain/Pressure: Absent     Pelvic: Cervical exam deferred        Extremities: Normal range of motion.  Edema: Trace  Mental Status:  Normal mood and affect. Normal behavior. Normal judgment and thought content.   Assessment and Plan:  Pregnancy: G1P0 at 2980w6d  1. Supervision of normal first pregnancy, antepartum     27 lb maternal weight gain this pregnancy.  Discussed maternity leave from work later on in the pregnancy.    2. Maternal varicella, non-immune     Varicella postpartum  3. Late prenatal care     @16  wks   Preterm labor symptoms  and general obstetric precautions including but not limited to vaginal bleeding, contractions, leaking of fluid and fetal movement were reviewed in detail with the patient. Please refer to After Visit Summary for other counseling recommendations.  Return in about 2 weeks (around 05/03/2017) for ROB, repeat 1 hour OGTT J[employer] next week.   Roe Coombsachelle A Haley Roza, CNM

## 2017-04-20 LAB — CBC
HEMOGLOBIN: 11.7 g/dL (ref 11.1–15.9)
Hematocrit: 35.8 % (ref 34.0–46.6)
MCH: 30.5 pg (ref 26.6–33.0)
MCHC: 32.7 g/dL (ref 31.5–35.7)
MCV: 93 fL (ref 79–97)
PLATELETS: 197 10*3/uL (ref 150–379)
RBC: 3.84 x10E6/uL (ref 3.77–5.28)
RDW: 13.9 % (ref 12.3–15.4)
WBC: 9.9 10*3/uL (ref 3.4–10.8)

## 2017-04-20 LAB — HIV ANTIBODY (ROUTINE TESTING W REFLEX): HIV SCREEN 4TH GENERATION: NONREACTIVE

## 2017-04-20 LAB — HEMOGLOBIN A1C
ESTIMATED AVERAGE GLUCOSE: 100 mg/dL
HEMOGLOBIN A1C: 5.1 % (ref 4.8–5.6)

## 2017-04-20 LAB — GLUCOSE, RANDOM: GLUCOSE: 75 mg/dL (ref 65–99)

## 2017-04-20 LAB — RPR: RPR Ser Ql: NONREACTIVE

## 2017-04-24 ENCOUNTER — Other Ambulatory Visit: Payer: Medicaid Other

## 2017-04-24 DIAGNOSIS — Z3400 Encounter for supervision of normal first pregnancy, unspecified trimester: Secondary | ICD-10-CM

## 2017-04-25 LAB — GLUCOSE TOLERANCE, 2 HOURS W/ 1HR
GLUCOSE, 2 HOUR: 99 mg/dL (ref 65–152)
Glucose, 1 hour: 166 mg/dL (ref 65–179)
Glucose, Fasting: 72 mg/dL (ref 65–91)

## 2017-04-26 ENCOUNTER — Other Ambulatory Visit: Payer: Self-pay | Admitting: Certified Nurse Midwife

## 2017-04-26 DIAGNOSIS — Z3400 Encounter for supervision of normal first pregnancy, unspecified trimester: Secondary | ICD-10-CM

## 2017-05-03 ENCOUNTER — Ambulatory Visit (INDEPENDENT_AMBULATORY_CARE_PROVIDER_SITE_OTHER): Payer: Medicaid Other | Admitting: Certified Nurse Midwife

## 2017-05-03 ENCOUNTER — Other Ambulatory Visit: Payer: Self-pay

## 2017-05-03 VITALS — BP 90/61 | HR 110 | Wt 130.9 lb

## 2017-05-03 DIAGNOSIS — Z23 Encounter for immunization: Secondary | ICD-10-CM | POA: Diagnosis not present

## 2017-05-03 DIAGNOSIS — Z2839 Other underimmunization status: Secondary | ICD-10-CM

## 2017-05-03 DIAGNOSIS — O09893 Supervision of other high risk pregnancies, third trimester: Secondary | ICD-10-CM

## 2017-05-03 DIAGNOSIS — Z283 Underimmunization status: Secondary | ICD-10-CM

## 2017-05-03 DIAGNOSIS — O09899 Supervision of other high risk pregnancies, unspecified trimester: Secondary | ICD-10-CM

## 2017-05-03 DIAGNOSIS — Z3400 Encounter for supervision of normal first pregnancy, unspecified trimester: Secondary | ICD-10-CM

## 2017-05-03 NOTE — Progress Notes (Signed)
   PRENATAL VISIT NOTE  Subjective:  Teresa Johnson is a 21 y.o. G1P0 at [redacted]w[redacted]d being seen today for ongoing prenatal care.  She is currently monitored for the following issues for this low-risk pregnancy and has Supervision of normal first pregnancy, antepartum; Late prenatal care; and Maternal varicella, non-immune on their problem list.  Constipation reported: OTC Colace, fluids, fruits/veg. Discussed.   Patient reports no bleeding, no contractions, no cramping, no leaking and vomiting.  Contractions: Not present. Vag. Bleeding: None.  Movement: Present. Denies leaking of fluid.   The following portions of the patient's history were reviewed and updated as appropriate: allergies, current medications, past family history, past medical history, past social history, past surgical history and problem list. Problem list updated.  Objective:   Vitals:   05/03/17 1018  BP: 90/61  Pulse: (!) 110  Weight: 130 lb 14.4 oz (59.4 kg)    Fetal Status: Fetal Heart Rate (bpm): 150' doppler Fundal Height: 30 cm Movement: Present     General:  Alert, oriented and cooperative. Patient is in no acute distress.  Skin: Skin is warm and dry. No rash noted.   Cardiovascular: Normal heart rate noted  Respiratory: Normal respiratory effort, no problems with respiration noted  Abdomen: Soft, gravid, appropriate for gestational age.  Pain/Pressure: Absent     Pelvic: Cervical exam deferred        Extremities: Normal range of motion.  Edema: None  Mental Status:  Normal mood and affect. Normal behavior. Normal judgment and thought content.   Assessment and Plan:  Pregnancy: G1P0 at [redacted]w[redacted]d  1. Supervision of normal first pregnancy, antepartum     Doing well.  Normal discomforts of pregnancy  2. Maternal varicella, non-immune     Varicella postpartum.   Preterm labor symptoms and general obstetric precautions including but not limited to vaginal bleeding, contractions, leaking of fluid and fetal movement  were reviewed in detail with the patient. Please refer to After Visit Summary for other counseling recommendations.  Return in about 2 weeks (around 05/17/2017) for ROB.   Roe Coombsachelle A Mike Berntsen, CNM

## 2017-05-03 NOTE — Progress Notes (Signed)
TDAP given in right deltoid, tolerated well.

## 2017-05-03 NOTE — Patient Instructions (Addendum)
AREA PEDIATRIC/FAMILY [place] [address] [place], [place]  [address] Phone - [phone]   Fax - [phone]  [facility] [address] [place], [place] [address] Phone - [phone]   Fax - [place] [address] [place], [place]  [address] Phone - [phone]   Fax - [phone]  [place] [address] [place] [address] [place], [place]  [address] Phone - [phone]   Fax - [phone]  [place] [address] [place], [place]  [address] Phone - [phone]   Fax - [phone]  [facility] [address] [place], [place]  [address] Phone - [phone]   Fax - [place] [address] [place], [place]  [address] Phone - [phone]   Fax - [phone]  [place] [address] [place], [place] [address] [place], [place]  [address] Phone - [phone]   Fax - [place] [address] [place], [place]  [address] Phone - [phone]   Fax - [phone] [facility] [place]. [address] [place], [place]  [address] Phone - [phone]   Fax - [phone]  [place] [address] [place], [place]  [address] Phone - [phone]   Fax - [phone]  [facility] [place], [place], [place]  [address] Phone - [phone]   Fax - [place] [address] [place], [place]  [address] Phone - [phone]   Fax - [phone]  [facility] [address] [place], [place]  [address] Phone - [place], [place]  [address] Phone - [phone]   Fax - [place] [address], [place], [place]  [address] Phone - [phone]   Fax - [phone]  [place] [address] [place], [place]  [address] Phone - [phone]   Fax - [place]. [place], [place]  [address] Phone - [phone]   Fax - [place], [place], [place]  [address] Phone - [phone]   Fax - [place] [address] [place], [place]  [address] Phone - [phone]   Fax - [phone]  [facility] [address] [place], [place]  [address] Phone - [phone]   Fax - [place] [address] [place], [place]  [address] Phone - [phone]   Fax - [phone]  [place] [address] [place], [place]  [address] Phone - [phone]   Fax - [phone] [place] [address] [place],   [address] Phone - [phone]   Fax - [place] [address] [place],   [address] Phone - [phone]   Fax - [place] [address], [place],   [address] Phone - [phone]   Fax - [phone]  [place] [address] [place] [address] Phone [phone]  Fax [phone]  Contraception Choices Contraception (birth control) is the use of any methods or devices to prevent  pregnancy. Below are some methods to help avoid pregnancy. Hormonal methods  Contraceptive implant. This is a thin, plastic tube containing progesterone hormone. It does not contain estrogen hormone. Your health care provider inserts the tube in the inner part of the upper arm. The tube can remain in place for up to 3 years. After 3 years, the implant must be removed. The implant prevents the  ovaries from releasing an egg (ovulation), thickens the cervical mucus to prevent sperm from entering the uterus, and thins the lining of the inside of the uterus.  Progesterone-only injections. These injections are given every 3 months by your health care provider to prevent pregnancy. This synthetic progesterone hormone stops the ovaries from releasing eggs. It also thickens cervical mucus and changes the uterine lining. This makes it harder for sperm to survive in the uterus.  Birth control pills. These pills contain estrogen and progesterone hormone. They work by preventing the ovaries from releasing eggs (ovulation). They also cause the cervical mucus to thicken, preventing the sperm from entering the uterus. Birth control pills are prescribed by a health care provider.Birth control pills can also be used to treat heavy periods.  Minipill. This type of birth control pill contains only the progesterone hormone. They are taken every day of each month and must be prescribed by your health care provider.  Birth control patch. The patch contains hormones similar to those in birth control pills. It must be changed once a week and is prescribed by a health care provider.  Vaginal ring. The ring contains hormones similar to those in birth control pills. It is left in the vagina for 3 weeks, removed for 1 week, and then a new one is put back in place. The patient must be comfortable inserting and removing the ring from the vagina.A health care provider's prescription is necessary.  Emergency contraception. Emergency contraceptives prevent pregnancy after unprotected sexual intercourse. This pill can be taken right after sex or up to 5 days after unprotected sex. It is most effective the sooner you take the pills after having sexual intercourse. Most emergency contraceptive pills are available without a prescription. Check with your pharmacist. Do not use emergency contraception as your only form of birth  control. Barrier methods  Female condom. This is a thin sheath (latex or rubber) that is worn over the penis during sexual intercourse. It can be used with spermicide to increase effectiveness.  Female condom. This is a soft, loose-fitting sheath that is put into the vagina before sexual intercourse.  Diaphragm. This is a soft, latex, dome-shaped barrier that must be fitted by a health care provider. It is inserted into the vagina, along with a spermicidal jelly. It is inserted before intercourse. The diaphragm should be left in the vagina for 6 to 8 hours after intercourse.  Cervical cap. This is a round, soft, latex or plastic cup that fits over the cervix and must be fitted by a health care provider. The cap can be left in place for up to 48 hours after intercourse.  Sponge. This is a soft, circular piece of polyurethane foam. The sponge has spermicide in it. It is inserted into the vagina after wetting it and before sexual intercourse.  Spermicides. These are chemicals that kill or block sperm from entering the cervix and uterus. They come in the form of creams, jellies, suppositories, foam, or tablets. They do not require a prescription. They are inserted into the vagina with an applicator before having sexual intercourse. The process   must be repeated every time you have sexual intercourse. Intrauterine contraception  Intrauterine device (IUD). This is a T-shaped device that is put in a woman's uterus during a menstrual period to prevent pregnancy. There are 2 types: ? Copper IUD. This type of IUD is wrapped in copper wire and is placed inside the uterus. Copper makes the uterus and fallopian tubes produce a fluid that kills sperm. It can stay in place for 10 years. ? Hormone IUD. This type of IUD contains the hormone progestin (synthetic progesterone). The hormone thickens the cervical mucus and prevents sperm from entering the uterus, and it also thins the uterine lining to prevent  implantation of a fertilized egg. The hormone can weaken or kill the sperm that get into the uterus. It can stay in place for 3-5 years, depending on which type of IUD is used. Permanent methods of contraception  Female tubal ligation. This is when the woman's fallopian tubes are surgically sealed, tied, or blocked to prevent the egg from traveling to the uterus.  Hysteroscopic sterilization. This involves placing a small coil or insert into each fallopian tube. Your doctor uses a technique called hysteroscopy to do the procedure. The device causes scar tissue to form. This results in permanent blockage of the fallopian tubes, so the sperm cannot fertilize the egg. It takes about 3 months after the procedure for the tubes to become blocked. You must use another form of birth control for these 3 months.  Female sterilization. This is when the female has the tubes that carry sperm tied off (vasectomy).This blocks sperm from entering the vagina during sexual intercourse. After the procedure, the man can still ejaculate fluid (semen). Natural planning methods  Natural family planning. This is not having sexual intercourse or using a barrier method (condom, diaphragm, cervical cap) on days the woman could become pregnant.  Calendar method. This is keeping track of the length of each menstrual cycle and identifying when you are fertile.  Ovulation method. This is avoiding sexual intercourse during ovulation.  Symptothermal method. This is avoiding sexual intercourse during ovulation, using a thermometer and ovulation symptoms.  Post-ovulation method. This is timing sexual intercourse after you have ovulated. Regardless of which type or method of contraception you choose, it is important that you use condoms to protect against the transmission of sexually transmitted infections (STIs). Talk with your health care provider about which form of contraception is most appropriate for you. This information is not  intended to replace advice given to you by your health care provider. Make sure you discuss any questions you have with your health care provider. Document Released: 05/09/2005 Document Revised: 10/15/2015 Document Reviewed: 11/01/2012 Elsevier Interactive Patient Education  2017 [employer]  Third Trimester of Pregnancy The third trimester is from week 29 through week 42, months 7 through 9. This trimester is when your unborn baby (fetus) is growing very fast. At the end of the ninth month, the unborn baby is about 20 inches in length. It weighs about 6-10 pounds. Follow these instructions at home:  Avoid all smoking, herbs, and alcohol. Avoid drugs not approved by your doctor.  Do not use any tobacco products, including cigarettes, chewing tobacco, and electronic cigarettes. If you need help quitting, ask your doctor. You may get counseling or other support to help you quit.  Only take medicine as told by your doctor. Some medicines are safe and some are not during pregnancy.  Exercise only as told by your doctor. Stop exercising if you  start having cramps.  Eat regular, healthy meals.  Wear a good support bra if your breasts are tender.  Do not use hot tubs, steam rooms, or saunas.  Wear your seat belt when driving.  Avoid raw meat, uncooked cheese, and liter boxes and soil used by cats.  Take your prenatal vitamins.  Take 1500-2000 milligrams of calcium daily starting at the 20th week of pregnancy until you deliver your baby.  Try taking medicine that helps you poop (stool softener) as needed, and if your doctor approves. Eat more fiber by eating fresh fruit, vegetables, and whole grains. Drink enough fluids to keep your pee (urine) clear or pale yellow.  Take warm water baths (sitz baths) to soothe pain or discomfort caused by hemorrhoids. Use hemorrhoid cream if your doctor approves.  If you have puffy, bulging veins (varicose veins), wear support hose. Raise (elevate) your  feet for 15 minutes, 3-4 times a day. Limit salt in your diet.  Avoid heavy lifting, wear low heels, and sit up straight.  Rest with your legs raised if you have leg cramps or low back pain.  Visit your dentist if you have not gone during your pregnancy. Use a soft toothbrush to brush your teeth. Be gentle when you floss.  You can have sex (intercourse) unless your doctor tells you not to.  Do not travel far distances unless you must. Only do so with your doctor's approval.  Take prenatal classes.  Practice driving to the hospital.  Pack your hospital bag.  Prepare the baby's room.  Go to your doctor visits. Get help if:  You are not sure if you are in labor or if your water has broken.  You are dizzy.  You have mild cramps or pressure in your lower belly (abdominal).  You have a nagging pain in your belly area.  You continue to feel sick to your stomach (nauseous), throw up (vomit), or have watery poop (diarrhea).  You have bad smelling fluid coming from your vagina.  You have pain with peeing (urination). Get help right away if:  You have a fever.  You are leaking fluid from your vagina.  You are spotting or bleeding from your vagina.  You have severe belly cramping or pain.  You lose or gain weight rapidly.  You have trouble catching your breath and have chest pain.  You notice sudden or extreme puffiness (swelling) of your face, hands, ankles, feet, or legs.  You have not felt the baby move in over an hour.  You have severe headaches that do not go away with medicine.  You have vision changes. This information is not intended to replace advice given to you by your health care provider. Make sure you discuss any questions you have with your health care provider. Document Released: 08/03/2009 Document Revised: 10/15/2015 Document Reviewed: 07/10/2012 Elsevier Interactive Patient Education  2017 [employer]

## 2017-05-19 ENCOUNTER — Encounter: Payer: Self-pay | Admitting: *Deleted

## 2017-05-19 ENCOUNTER — Encounter: Payer: Self-pay | Admitting: Obstetrics and Gynecology

## 2017-05-19 ENCOUNTER — Ambulatory Visit (INDEPENDENT_AMBULATORY_CARE_PROVIDER_SITE_OTHER): Payer: Medicaid Other | Admitting: Obstetrics and Gynecology

## 2017-05-19 DIAGNOSIS — Z3400 Encounter for supervision of normal first pregnancy, unspecified trimester: Secondary | ICD-10-CM

## 2017-05-19 NOTE — Progress Notes (Signed)
ROB states she is having tooth pain.

## 2017-05-19 NOTE — Progress Notes (Signed)
   PRENATAL VISIT NOTE  Subjective:  Teresa Johnson is a 21 y.o. G1P0 at 835w1d being seen today for ongoing prenatal care.  She is currently monitored for the following issues for this low-risk pregnancy and has Supervision of normal first pregnancy, antepartum; Late prenatal care; and Maternal varicella, non-immune on their problem list.  Patient reports tooth ache. She is scheduled to see the dentist on 05/30/2017.  Contractions: Not present. Vag. Bleeding: None.  Movement: Present. Denies leaking of fluid.   The following portions of the patient's history were reviewed and updated as appropriate: allergies, current medications, past family history, past medical history, past social history, past surgical history and problem list. Problem list updated.  Objective:   Vitals:   05/19/17 1008  BP: 96/60  Pulse: 98  Weight: 130 lb (59 kg)    Fetal Status: Fetal Heart Rate (bpm): 140 Fundal Height: 32 cm Movement: Present     General:  Alert, oriented and cooperative. Patient is in no acute distress.  Skin: Skin is warm and dry. No rash noted.   Cardiovascular: Normal heart rate noted  Respiratory: Normal respiratory effort, no problems with respiration noted  Abdomen: Soft, gravid, appropriate for gestational age.  Pain/Pressure: Absent     Pelvic: Cervical exam deferred        Extremities: Normal range of motion.  Edema: None  Mental Status:  Normal mood and affect. Normal behavior. Normal judgment and thought content.   Assessment and Plan:  Pregnancy: G1P0 at 495w1d  1. Supervision of normal first pregnancy, antepartum Patient is doing well without complaints Patient plans depo-provera for contraception Patient has a pediatrician Dental letter provided in anticipation of upcoming 05/30/2017 visit due to toothach  Preterm labor symptoms and general obstetric precautions including but not limited to vaginal bleeding, contractions, leaking of fluid and fetal movement were reviewed in  detail with the patient. Please refer to After Visit Summary for other counseling recommendations.  Return in about 2 weeks (around 06/02/2017) for ROB.   Catalina AntiguaPeggy Courtnie Brenes, MD

## 2017-05-23 NOTE — L&D Delivery Note (Signed)
Patient is 22 y.o. G1P0 585w4d admitted for SROM. S/p IOL with foley bulb, followed by Pitocin.  Prenatal course also complicated by varicella non-immune, late prenatal care.  Delivery Note At 5:18 PM a viable female was delivered via Vaginal, Spontaneous (Presentation: ROA ).  APGAR: pending; weight pending .   Placenta status: spontaneous, intact .  Cord:  3 vessel   Anesthesia:  Epidural  Episiotomy: None Lacerations: None Suture Repair: None Est. Blood Loss (mL): 50   Head delivered ROA. No nuchal cord present. Shoulder delivered with hand presenting first. Body then delivered in usual fashion. 2 body cords around each arm present. Infant with spontaneous cry, placed on mother's abdomen, dried and bulb suctioned. Cord clamped x 2 after 1-minute delay, and cut by family member. Baby taken to warmer. Cord blood drawn. Placenta delivered spontaneously with gentle cord traction. Fundus firm with massage and Pitocin. Perineum inspected and found to have no laceration.  Mom to postpartum.  Baby to warmer.  Teresa ManisSherin Corrado Hymon, DO PGY-1 07/03/2017, 5:32 PM

## 2017-06-01 ENCOUNTER — Encounter: Payer: Self-pay | Admitting: Obstetrics & Gynecology

## 2017-06-01 ENCOUNTER — Ambulatory Visit (INDEPENDENT_AMBULATORY_CARE_PROVIDER_SITE_OTHER): Payer: Medicaid Other | Admitting: Obstetrics & Gynecology

## 2017-06-01 DIAGNOSIS — O2613 Low weight gain in pregnancy, third trimester: Secondary | ICD-10-CM

## 2017-06-01 DIAGNOSIS — Z3400 Encounter for supervision of normal first pregnancy, unspecified trimester: Secondary | ICD-10-CM

## 2017-06-01 NOTE — Patient Instructions (Signed)

## 2017-06-01 NOTE — Progress Notes (Signed)
   PRENATAL VISIT NOTE  Subjective:  Teresa Johnson is a 22 y.o. G1P0 at 3669w0d being seen today for ongoing prenatal care.  She is currently monitored for the following issues for this low-risk pregnancy and has Supervision of normal first pregnancy, antepartum; Late prenatal care; Maternal varicella, non-immune; and Poor weight gain of pregnancy on their problem list.  Patient reports poor appetite.  Contractions: Not present. Vag. Bleeding: None.  Movement: Present. Denies leaking of fluid.   The following portions of the patient's history were reviewed and updated as appropriate: allergies, current medications, past family history, past medical history, past social history, past surgical history and problem list. Problem list updated.  Objective:   Vitals:   06/01/17 1435  BP: 94/60  Pulse: 97  Weight: 126 lb (57.2 kg)    Fetal Status: Fetal Heart Rate (bpm): 144   Movement: Present     General:  Alert, oriented and cooperative. Patient is in no acute distress.  Skin: Skin is warm and dry. No rash noted.   Cardiovascular: Normal heart rate noted  Respiratory: Normal respiratory effort, no problems with respiration noted  Abdomen: Soft, gravid, appropriate for gestational age.  Pain/Pressure: Absent     Pelvic: Cervical exam deferred        Extremities: Normal range of motion.  Edema: Trace  Mental Status:  Normal mood and affect. Normal behavior. Normal judgment and thought content.   Assessment and Plan:  Pregnancy: G1P0 at 6569w0d  1. Supervision of normal first pregnancy, antepartum Weight loss noted, denies nausea or reflux - Ambulatory referral to Nutrition and Diabetic Education  2. Low weight gain during pregnancy in third trimester  - US MFM OB FOLLOW UP; Future  Preterm labor symptoms and general obstetric precautions including but not limited to vaginal bleeding, contractions, leaking of fluid and fetal movement were reviewed in detail with the patient. Please  refer to After Visit Summary for other counseling recommendations.  Return in about 2 weeks (around 06/15/2017).   Scheryl DarterJames Arnold, MD

## 2017-06-01 NOTE — Progress Notes (Signed)
Pt c/o vaginal itching.x 2 days. Denies any odor or discharge.

## 2017-06-08 ENCOUNTER — Encounter: Payer: Self-pay | Admitting: Dietician

## 2017-06-08 ENCOUNTER — Encounter: Payer: Medicaid Other | Attending: Certified Nurse Midwife | Admitting: Dietician

## 2017-06-08 DIAGNOSIS — Z713 Dietary counseling and surveillance: Secondary | ICD-10-CM | POA: Insufficient documentation

## 2017-06-08 DIAGNOSIS — O26899 Other specified pregnancy related conditions, unspecified trimester: Secondary | ICD-10-CM | POA: Diagnosis not present

## 2017-06-08 DIAGNOSIS — R634 Abnormal weight loss: Secondary | ICD-10-CM | POA: Diagnosis not present

## 2017-06-08 DIAGNOSIS — O2613 Low weight gain in pregnancy, third trimester: Secondary | ICD-10-CM

## 2017-06-08 NOTE — Progress Notes (Signed)
  Medical Nutrition Therapy:  Appt start time: 0930 end time:  1000. Patient late for the appointment  Assessment:  Primary concerns today: Patient is here today with her boyfriend due to losing 4 llbs in the past month.  She is [redacted] weeks gestation.  She still has nausea but less than during the first trimester.  BMI 18.9 prepregnancy (100 lbs prepregnancy) She reports that her appetite is poor and foods that she used to like taste bad.  When she cooks she cannot eat it as it makes her nauseous. Tired and bored of eating the same food over and over.  Her boyfriend is concerned about her eating, has seen her not eat as much and wants her to take care of herself and the baby. She works 8-9 hours per day in [employer] and is not given breaks to eat.  She reports discussing this with her employer but still is not given a break.  She plans on going out on leave starting 06/12/17 due to this reason.  Patient lives with her boyfriend's family and boyfriend. Patient works at [employer] and is to start her leave January 21.  Preferred Learning Style:   No preference indicated   Learning Readiness:   Contemplating   MEDICATIONS: prenatal vitamin   DIETARY INTAKE:  Usual eating pattern includes 2-3 meals and 3 snacks per day.  24-hr recall:  B (8:30 AM): pickles, fruit, juice or water  Snk (10 AM): chicken tenders, mashed potatoes, sweet tea or water  L (6 PM): fast food similar to dinner Snk ( PM): nabs, chips, fruit or cereal and milk D (9-11PM): McDonald's or other fast food or vegetarian chinese Snk (2-3 am): pickle, orange, water when wakes up Beverages: sweet tea, water, juice, milk  Usual physical activity: work related on her feet most of the day  Estimated energy needs: 2400 calories 75 g protein  Progress Towards Goal(s):  In progress.   Nutritional Diagnosis:  [place]-3.2 Unintentional weight loss As related to poor appetite and work schedule.  As evidenced by patient  report and diet hx.    Intervention:  Nutrition education related to tips for proper nutrition and weight gain in pregnancy.  Discussed need to have breaks at work and tips to increase nutrition quality of her diet..  Consider frozen meals to increase variety. Consider cold meals (salad, chicken or tuna, egg, cheese, sunflower seeds, beans) Beans, cheese, nuts, tofu, vegetarian "meat" if you can't tolerate meat. High quality nutrition snacks  Boiled egg and fruit  Nuts   Yogurt, fruit, granola  Cheese and crackers  Peanut butter and apple or crackers 4 servings of dairy daily Drink plenty of fluid  Eat every 3 hours throughout the day.  Legally you are required to have breaks at work.  Teaching Method Utilized:  Auditory  Handouts given during visit include:  Pregnancy nutrition tips  Pregnancy nutrition therapy from AND  Barriers to learning/adherence to lifestyle change: work shedule  Demonstrated degree of understanding via:  Teach Back   Monitoring/Evaluation:  Dietary intake, exercise, and body weight prn.

## 2017-06-08 NOTE — Patient Instructions (Signed)
Consider frozen meals to increase variety. Consider cold meals (salad, chicken or tuna, egg, cheese, sunflower seeds, beans) Beans, cheese, nuts, tofu, vegetarian "meat" if you can't tolerate meat. High quality nutrition snacks  Boiled egg and fruit  Nuts   Yogurt, fruit, granola  Cheese and crackers  Peanut butter and apple or crackers 4 servings of dairy daily Drink plenty of fluid  Eat every 3 hours throughout the day.  Legally you are required to have breaks at work.

## 2017-06-12 ENCOUNTER — Other Ambulatory Visit: Payer: Self-pay | Admitting: Obstetrics & Gynecology

## 2017-06-12 ENCOUNTER — Ambulatory Visit (HOSPITAL_COMMUNITY)
Admission: RE | Admit: 2017-06-12 | Discharge: 2017-06-12 | Disposition: A | Discharge status: Home / Self Care | Payer: Medicaid Other | Source: Ambulatory Visit | Attending: Obstetrics & Gynecology | Admitting: Obstetrics & Gynecology

## 2017-06-12 DIAGNOSIS — O2613 Low weight gain in pregnancy, third trimester: Secondary | ICD-10-CM | POA: Diagnosis not present

## 2017-06-12 DIAGNOSIS — Z3A35 35 weeks gestation of pregnancy: Secondary | ICD-10-CM | POA: Diagnosis not present

## 2017-06-14 ENCOUNTER — Other Ambulatory Visit (HOSPITAL_COMMUNITY)
Admission: RE | Admit: 2017-06-14 | Discharge: 2017-06-14 | Disposition: A | Discharge status: Home / Self Care | Payer: Medicaid Other | Source: Ambulatory Visit | Attending: Certified Nurse Midwife | Admitting: Certified Nurse Midwife

## 2017-06-14 ENCOUNTER — Ambulatory Visit (INDEPENDENT_AMBULATORY_CARE_PROVIDER_SITE_OTHER): Payer: Medicaid Other | Admitting: Obstetrics and Gynecology

## 2017-06-14 ENCOUNTER — Encounter: Payer: Self-pay | Admitting: Obstetrics and Gynecology

## 2017-06-14 VITALS — BP 106/69 | HR 101 | Wt 129.0 lb

## 2017-06-14 DIAGNOSIS — Z3400 Encounter for supervision of normal first pregnancy, unspecified trimester: Secondary | ICD-10-CM

## 2017-06-14 DIAGNOSIS — Z283 Underimmunization status: Secondary | ICD-10-CM

## 2017-06-14 DIAGNOSIS — Z2839 Other underimmunization status: Secondary | ICD-10-CM

## 2017-06-14 DIAGNOSIS — O09893 Supervision of other high risk pregnancies, third trimester: Secondary | ICD-10-CM

## 2017-06-14 DIAGNOSIS — O09899 Supervision of other high risk pregnancies, unspecified trimester: Secondary | ICD-10-CM

## 2017-06-14 DIAGNOSIS — O2613 Low weight gain in pregnancy, third trimester: Secondary | ICD-10-CM

## 2017-06-14 LAB — OB RESULTS CONSOLE GBS: GBS: NEGATIVE

## 2017-06-14 NOTE — Progress Notes (Signed)
Subjective:  Teresa DonningKieara Johnson is a 22 y.o. G1P0 at 431w6d being seen today for ongoing prenatal care.  She is currently monitored for the following issues for this high-risk pregnancy and has Supervision of normal first pregnancy, antepartum; Late prenatal care; Maternal varicella, non-immune; and Poor weight gain of pregnancy on their problem list.  Patient reports no complaints.  Contractions: Not present. Vag. Bleeding: None.  Movement: Present. Denies leaking of fluid.   The following portions of the patient's history were reviewed and updated as appropriate: allergies, current medications, past family history, past medical history, past social history, past surgical history and problem list. Problem list updated.  Objective:   Vitals:   06/14/17 1039  BP: 106/69  Pulse: (!) 101  Weight: 129 lb (58.5 kg)    Fetal Status: Fetal Heart Rate (bpm): 145   Movement: Present     General:  Alert, oriented and cooperative. Patient is in no acute distress.  Skin: Skin is warm and dry. No rash noted.   Cardiovascular: Normal heart rate noted  Respiratory: Normal respiratory effort, no problems with respiration noted  Abdomen: Soft, gravid, appropriate for gestational age. Pain/Pressure: Absent     Pelvic:  Cervical exam performed        Extremities: Normal range of motion.  Edema: Trace  Mental Status: Normal mood and affect. Normal behavior. Normal judgment and thought content.   Urinalysis:      Assessment and Plan:  Pregnancy: G1P0 at 661w6d  1. Supervision of normal first pregnancy, antepartum Labor precautions Vaginal cultures/GBS today  2. Maternal varicella, non-immune Vaccine PP  3. Low weight gain during pregnancy in third trimester Wt up from last visit U/S appropriate interval fetal growth, 2545 gm, 49 %  Preterm labor symptoms and general obstetric precautions including but not limited to vaginal bleeding, contractions, leaking of fluid and fetal movement were reviewed in  detail with the patient. Please refer to After Visit Summary for other counseling recommendations.  Return in about 1 week (around 06/21/2017) for OB visit.   Hermina StaggersErvin, Solymar Grace L, MD

## 2017-06-15 ENCOUNTER — Encounter: Payer: Medicaid Other | Admitting: Certified Nurse Midwife

## 2017-06-15 LAB — CERVICOVAGINAL ANCILLARY ONLY
Bacterial vaginitis: NEGATIVE
Candida vaginitis: POSITIVE — AB
Chlamydia: NEGATIVE
Neisseria Gonorrhea: NEGATIVE
Trichomonas: NEGATIVE

## 2017-06-16 ENCOUNTER — Other Ambulatory Visit: Payer: Self-pay

## 2017-06-16 LAB — STREP GP B NAA: STREP GROUP B AG: NEGATIVE

## 2017-06-16 MED ORDER — TERCONAZOLE 0.4 % VA CREA: 1.0000 | TOPICAL_CREAM | Freq: Every day | VAGINAL | 0 refills | Status: DC

## 2017-06-21 ENCOUNTER — Encounter: Payer: Self-pay | Admitting: Certified Nurse Midwife

## 2017-06-21 ENCOUNTER — Ambulatory Visit (INDEPENDENT_AMBULATORY_CARE_PROVIDER_SITE_OTHER): Payer: Medicaid Other | Admitting: Certified Nurse Midwife

## 2017-06-21 VITALS — BP 99/65 | HR 111 | Wt 130.0 lb

## 2017-06-21 DIAGNOSIS — Z2839 Other underimmunization status: Secondary | ICD-10-CM

## 2017-06-21 DIAGNOSIS — Z3400 Encounter for supervision of normal first pregnancy, unspecified trimester: Secondary | ICD-10-CM

## 2017-06-21 DIAGNOSIS — Z283 Underimmunization status: Secondary | ICD-10-CM

## 2017-06-21 DIAGNOSIS — O09899 Supervision of other high risk pregnancies, unspecified trimester: Secondary | ICD-10-CM

## 2017-06-21 NOTE — Progress Notes (Signed)
   PRENATAL VISIT NOTE  Subjective:  Teresa Johnson is a 22 y.o. G1P0 at 5477w6d being seen today for ongoing prenatal care.  She is currently monitored for the following issues for this low-risk pregnancy and has Supervision of normal first pregnancy, antepartum; Late prenatal care; and Maternal varicella, non-immune on their problem list.  Patient reports no complaints.  Contractions: Irritability. Vag. Bleeding: None.  Movement: Present. Denies leaking of fluid.   The following portions of the patient's history were reviewed and updated as appropriate: allergies, current medications, past family history, past medical history, past social history, past surgical history and problem list. Problem list updated.  Objective:   Vitals:   06/21/17 1423  BP: 99/65  Pulse: (!) 111  Weight: 130 lb (59 kg)    Fetal Status: Fetal Heart Rate (bpm): 150; doppler Fundal Height: 34 cm Movement: Present     General:  Alert, oriented and cooperative. Patient is in no acute distress.  Skin: Skin is warm and dry. No rash noted.   Cardiovascular: Normal heart rate noted  Respiratory: Normal respiratory effort, no problems with respiration noted  Abdomen: Soft, gravid, appropriate for gestational age.  Pain/Pressure: Absent     Pelvic: Cervical exam deferred        Extremities: Normal range of motion.  Edema: None  Mental Status:  Normal mood and affect. Normal behavior. Normal judgment and thought content.   Assessment and Plan:  Pregnancy: G1P0 at 7577w6d  1. Supervision of normal first pregnancy, antepartum     Doing well. Normal discomforts of pregnancy discussed.  OTC Benadryl or Unisom for sleep.   2. Maternal varicella, non-immune     Varicella postpartum  Preterm labor symptoms and general obstetric precautions including but not limited to vaginal bleeding, contractions, leaking of fluid and fetal movement were reviewed in detail with the patient. Please refer to After Visit Summary for other  counseling recommendations.  Return in about 1 week (around 06/28/2017) for ROB.   Roe Coombsachelle A Carina Chaplin, CNM

## 2017-06-21 NOTE — Progress Notes (Signed)
Having contractions at night and trouble falling asleep.

## 2017-06-28 ENCOUNTER — Encounter: Payer: Self-pay | Admitting: Certified Nurse Midwife

## 2017-06-28 ENCOUNTER — Ambulatory Visit (INDEPENDENT_AMBULATORY_CARE_PROVIDER_SITE_OTHER): Payer: Medicaid Other | Admitting: Certified Nurse Midwife

## 2017-06-28 VITALS — BP 113/74 | HR 93 | Wt 129.8 lb

## 2017-06-28 DIAGNOSIS — Z283 Underimmunization status: Secondary | ICD-10-CM

## 2017-06-28 DIAGNOSIS — Z3400 Encounter for supervision of normal first pregnancy, unspecified trimester: Secondary | ICD-10-CM

## 2017-06-28 DIAGNOSIS — O09899 Supervision of other high risk pregnancies, unspecified trimester: Secondary | ICD-10-CM

## 2017-06-28 DIAGNOSIS — Z2839 Other underimmunization status: Secondary | ICD-10-CM

## 2017-06-28 DIAGNOSIS — O09893 Supervision of other high risk pregnancies, third trimester: Secondary | ICD-10-CM

## 2017-06-28 NOTE — Progress Notes (Signed)
   PRENATAL VISIT NOTE  Subjective:  Teresa Johnson is a 22 y.o. G1P0 at 5429w6d being seen today for ongoing prenatal care.  She is currently monitored for the following issues for this low-risk pregnancy and has Supervision of normal first pregnancy, antepartum; Late prenatal care; and Maternal varicella, non-immune on their problem list.  Patient reports no complaints.  Contractions: Irregular. Vag. Bleeding: None.  Movement: Absent. Denies leaking of fluid.   The following portions of the patient's history were reviewed and updated as appropriate: allergies, current medications, past family history, past medical history, past social history, past surgical history and problem list. Problem list updated.  Objective:   Vitals:   06/28/17 1448  BP: 113/74  Pulse: 93  Weight: 129 lb 12.8 oz (58.9 kg)    Fetal Status: Fetal Heart Rate (bpm): 135; doppler Fundal Height: 35 cm Movement: Absent     General:  Alert, oriented and cooperative. Patient is in no acute distress.  Skin: Skin is warm and dry. No rash noted.   Cardiovascular: Normal heart rate noted  Respiratory: Normal respiratory effort, no problems with respiration noted  Abdomen: Soft, gravid, appropriate for gestational age.  Pain/Pressure: Present     Pelvic: Cervical exam deferred        Extremities: Normal range of motion.  Edema: None  Mental Status:  Normal mood and affect. Normal behavior. Normal judgment and thought content.   Assessment and Plan:  Pregnancy: G1P0 at 5929w6d  1. Supervision of normal first pregnancy, antepartum     Doing well.   2. Maternal varicella, non-immune     Varicella postpartum  Term labor symptoms and general obstetric precautions including but not limited to vaginal bleeding, contractions, leaking of fluid and fetal movement were reviewed in detail with the patient. Please refer to After Visit Summary for other counseling recommendations.  Return in about 1 week (around 07/05/2017) for  ROB.   Roe Coombsachelle A Onia Shiflett, CNM

## 2017-06-28 NOTE — Progress Notes (Signed)
Legs are locking up

## 2017-06-28 NOTE — Patient Instructions (Signed)
AREA PEDIATRIC/FAMILY PRACTICE PHYSICIANS  [facility] [address], [address] [place], [place]  [address] Phone - [phone]   Fax - [phone]  [facility] [address] [place], [place] [address] Phone - [phone]   Fax - [phone]  [facility] [address] [place], [place]  [address] Phone - [phone]   Fax - [phone]  [facility] [address] [place], [place]  [address] Phone - [phone]   Fax - [phone]  [facility] [address] [place], [place]  [address] Phone - [phone]   Fax - [phone]  [facility] [address] [place], [place]  [address] Phone - [phone]   Fax - [phone]  [facility] [address] [place], [place]  [address] Phone - [phone]   Fax - [phone]  [facility] [address] [place], [place]  [address] Phone - [phone]   Fax - [phone]  [facility] [address] [place], [place]  [address] Phone - [phone]   Fax - [phone] [facility] [address] [place], [place]  [address] Phone - [phone]   Fax - [phone]  [facility] [address] [place], [place]  [address] Phone - [phone]   Fax - [phone]  [facility] [address] [place], [place]  [address] Phone - [phone]   Fax - [phone]  [facility] [address] [place], [place]  [address] Phone - [phone]   Fax - [phone]  [facility] GOSR[facility] [address] [place], [place]  [address] Phone - [phone]  [facility] [address] [place], [place]  [address] Phone - [phone]   Fax - [phone]  [facility] [address] [place], [place]  [address] Phone - [phone]   Fax - [phone]  [facility] [address] [place], [place]  [address] Phone - [phone]   Fax - [phone]  [facility] [address] [place], [place]  [address] Phone - [phone]   Fax - [phone]   [facility] [address] [place], [place]  [address] Phone - [phone]   Fax - [phone]  [facility] [address] [place], [place]  [address] Phone - [phone]   Fax - [phone]  [facility] [address] [place], [place]  [address] Phone - [phone]   Fax - [phone]  [facility] [address] [place], [place]  [address] Phone - [phone]   Fax - [phone]  [facility] [address] , [place]  [address] Phone - [phone]   Fax - [phone] [facility] [address] [place], [place]  [address] Phone - [phone]   Fax - [phone]  [facility] [address] [place], [place]  [address] Phone - [phone]   Fax - [phone]  [facility] [address] [place], [place]  [address] Phone - [phone]   Fax - [phone]  [facility] Charlene Flemming MD [address] [place] [address] Phone [phone]  Fax [phone]   

## 2017-07-03 ENCOUNTER — Inpatient Hospital Stay (HOSPITAL_COMMUNITY)
Admission: AD | Admit: 2017-07-03 | Discharge: 2017-07-05 | DRG: 807 | Disposition: A | Discharge status: Home / Self Care | Payer: Medicaid Other | Source: Ambulatory Visit | Attending: Obstetrics & Gynecology | Admitting: Obstetrics & Gynecology

## 2017-07-03 ENCOUNTER — Inpatient Hospital Stay (HOSPITAL_COMMUNITY): Payer: Medicaid Other | Admitting: Anesthesiology

## 2017-07-03 ENCOUNTER — Encounter (HOSPITAL_COMMUNITY): Payer: Self-pay | Admitting: *Deleted

## 2017-07-03 ENCOUNTER — Other Ambulatory Visit: Payer: Self-pay

## 2017-07-03 DIAGNOSIS — Z3A38 38 weeks gestation of pregnancy: Secondary | ICD-10-CM | POA: Diagnosis not present

## 2017-07-03 DIAGNOSIS — O4292 Full-term premature rupture of membranes, unspecified as to length of time between rupture and onset of labor: Principal | ICD-10-CM | POA: Diagnosis present

## 2017-07-03 LAB — RPR: RPR Ser Ql: NONREACTIVE

## 2017-07-03 LAB — CBC
HEMATOCRIT: 34.9 % — AB (ref 36.0–46.0)
Hemoglobin: 12.2 g/dL (ref 12.0–15.0)
MCH: 31.1 pg (ref 26.0–34.0)
MCHC: 35 g/dL (ref 30.0–36.0)
MCV: 89 fL (ref 78.0–100.0)
Platelets: 185 10*3/uL (ref 150–400)
RBC: 3.92 MIL/uL (ref 3.87–5.11)
RDW: 13.2 % (ref 11.5–15.5)
WBC: 8.7 10*3/uL (ref 4.0–10.5)

## 2017-07-03 LAB — TYPE AND SCREEN
ABO/RH(D): AB POS
Antibody Screen: NEGATIVE

## 2017-07-03 LAB — AMNISURE RUPTURE OF MEMBRANE (ROM) NOT AT ARMC: Amnisure ROM: POSITIVE

## 2017-07-03 MED ORDER — OXYTOCIN BOLUS FROM INFUSION
500.0000 mL | Freq: Once | INTRAVENOUS | Status: AC
  Administered 2017-07-03: 500 mL via INTRAVENOUS

## 2017-07-03 MED ORDER — PRENATAL MULTIVITAMIN CH
1.0000 | ORAL_TABLET | Freq: Every day | ORAL | Status: DC
  Administered 2017-07-04 – 2017-07-05 (×2): 1 via ORAL
  Filled 2017-07-03 (×2): qty 1

## 2017-07-03 MED ORDER — OXYTOCIN 40 UNITS IN LACTATED RINGERS INFUSION - SIMPLE MED
1.0000 m[IU]/min | INTRAVENOUS | Status: DC
  Administered 2017-07-03: 2 m[IU]/min via INTRAVENOUS

## 2017-07-03 MED ORDER — FENTANYL 2.5 MCG/ML BUPIVACAINE 1/10 % EPIDURAL INFUSION (WH - ANES)
14.0000 mL/h | INTRAMUSCULAR | Status: DC | PRN
  Administered 2017-07-03: 14 mL/h via EPIDURAL

## 2017-07-03 MED ORDER — ZOLPIDEM TARTRATE 5 MG PO TABS: 5.0000 mg | ORAL_TABLET | Freq: Every evening | ORAL | Status: DC | PRN

## 2017-07-03 MED ORDER — ONDANSETRON HCL 4 MG/2ML IJ SOLN: 4.0000 mg | INTRAMUSCULAR | Status: DC | PRN

## 2017-07-03 MED ORDER — LIDOCAINE HCL (PF) 1 % IJ SOLN
INTRAMUSCULAR | Status: DC | PRN
  Administered 2017-07-03 (×2): 5 mL

## 2017-07-03 MED ORDER — DIPHENHYDRAMINE HCL 50 MG/ML IJ SOLN: 12.5000 mg | INTRAMUSCULAR | Status: DC | PRN

## 2017-07-03 MED ORDER — EPHEDRINE 5 MG/ML INJ
10.0000 mg | INTRAVENOUS | Status: DC | PRN
  Filled 2017-07-03: qty 2

## 2017-07-03 MED ORDER — COCONUT OIL OIL
1.0000 "application " | TOPICAL_OIL | Status: DC | PRN
  Administered 2017-07-04: 1 via TOPICAL
  Filled 2017-07-03: qty 120

## 2017-07-03 MED ORDER — DIPHENHYDRAMINE HCL 25 MG PO CAPS: 25.0000 mg | ORAL_CAPSULE | Freq: Four times a day (QID) | ORAL | Status: DC | PRN

## 2017-07-03 MED ORDER — DIBUCAINE 1 % RE OINT: 1.0000 "application " | TOPICAL_OINTMENT | RECTAL | Status: DC | PRN

## 2017-07-03 MED ORDER — IBUPROFEN 600 MG PO TABS
600.0000 mg | ORAL_TABLET | Freq: Four times a day (QID) | ORAL | Status: DC
  Administered 2017-07-03 – 2017-07-05 (×7): 600 mg via ORAL
  Filled 2017-07-03 (×7): qty 1

## 2017-07-03 MED ORDER — PHENYLEPHRINE 40 MCG/ML (10ML) SYRINGE FOR IV PUSH (FOR BLOOD PRESSURE SUPPORT)
80.0000 ug | PREFILLED_SYRINGE | INTRAVENOUS | Status: DC | PRN
  Filled 2017-07-03: qty 5

## 2017-07-03 MED ORDER — LACTATED RINGERS IV SOLN: 500.0000 mL | INTRAVENOUS | Status: DC | PRN

## 2017-07-03 MED ORDER — WITCH HAZEL-GLYCERIN EX PADS: 1.0000 "application " | MEDICATED_PAD | CUTANEOUS | Status: DC | PRN

## 2017-07-03 MED ORDER — ACETAMINOPHEN 325 MG PO TABS
650.0000 mg | ORAL_TABLET | ORAL | Status: DC | PRN
  Administered 2017-07-04 – 2017-07-05 (×2): 650 mg via ORAL
  Filled 2017-07-03 (×2): qty 2

## 2017-07-03 MED ORDER — FENTANYL CITRATE (PF) 100 MCG/2ML IJ SOLN
100.0000 ug | INTRAMUSCULAR | Status: DC | PRN
  Administered 2017-07-03: 100 ug via INTRAVENOUS
  Filled 2017-07-03: qty 2

## 2017-07-03 MED ORDER — FENTANYL 2.5 MCG/ML BUPIVACAINE 1/10 % EPIDURAL INFUSION (WH - ANES)
INTRAMUSCULAR | Status: AC
  Filled 2017-07-03: qty 100

## 2017-07-03 MED ORDER — ONDANSETRON HCL 4 MG PO TABS: 4.0000 mg | ORAL_TABLET | ORAL | Status: DC | PRN

## 2017-07-03 MED ORDER — SENNOSIDES-DOCUSATE SODIUM 8.6-50 MG PO TABS
2.0000 | ORAL_TABLET | ORAL | Status: DC
  Administered 2017-07-03 – 2017-07-05 (×2): 2 via ORAL
  Filled 2017-07-03 (×2): qty 2

## 2017-07-03 MED ORDER — TETANUS-DIPHTH-ACELL PERTUSSIS 5-2.5-18.5 LF-MCG/0.5 IM SUSP: 0.5000 mL | Freq: Once | INTRAMUSCULAR | Status: DC

## 2017-07-03 MED ORDER — OXYTOCIN 40 UNITS IN LACTATED RINGERS INFUSION - SIMPLE MED
2.5000 [IU]/h | INTRAVENOUS | Status: DC
  Filled 2017-07-03: qty 1000

## 2017-07-03 MED ORDER — TERBUTALINE SULFATE 1 MG/ML IJ SOLN
0.2500 mg | Freq: Once | INTRAMUSCULAR | Status: DC | PRN
  Filled 2017-07-03: qty 1

## 2017-07-03 MED ORDER — PHENYLEPHRINE 40 MCG/ML (10ML) SYRINGE FOR IV PUSH (FOR BLOOD PRESSURE SUPPORT)
PREFILLED_SYRINGE | INTRAVENOUS | Status: AC
  Filled 2017-07-03: qty 10

## 2017-07-03 MED ORDER — SOD CITRATE-CITRIC ACID 500-334 MG/5ML PO SOLN: 30.0000 mL | ORAL | Status: DC | PRN

## 2017-07-03 MED ORDER — OXYCODONE-ACETAMINOPHEN 5-325 MG PO TABS: 2.0000 | ORAL_TABLET | ORAL | Status: DC | PRN

## 2017-07-03 MED ORDER — LACTATED RINGERS IV SOLN: 500.0000 mL | Freq: Once | INTRAVENOUS | Status: DC

## 2017-07-03 MED ORDER — LACTATED RINGERS IV SOLN
INTRAVENOUS | Status: DC
  Administered 2017-07-03: 08:00:00 via INTRAVENOUS

## 2017-07-03 MED ORDER — OXYCODONE-ACETAMINOPHEN 5-325 MG PO TABS: 1.0000 | ORAL_TABLET | ORAL | Status: DC | PRN

## 2017-07-03 MED ORDER — LIDOCAINE HCL (PF) 1 % IJ SOLN
30.0000 mL | INTRAMUSCULAR | Status: DC | PRN
  Filled 2017-07-03: qty 30

## 2017-07-03 MED ORDER — BENZOCAINE-MENTHOL 20-0.5 % EX AERO: 1.0000 "application " | INHALATION_SPRAY | CUTANEOUS | Status: DC | PRN

## 2017-07-03 MED ORDER — ACETAMINOPHEN 325 MG PO TABS
650.0000 mg | ORAL_TABLET | ORAL | Status: DC | PRN
Start: 2017-07-03 — End: 2017-07-03

## 2017-07-03 MED ORDER — ONDANSETRON HCL 4 MG/2ML IJ SOLN
4.0000 mg | Freq: Four times a day (QID) | INTRAMUSCULAR | Status: DC | PRN
  Administered 2017-07-03: 4 mg via INTRAVENOUS
  Filled 2017-07-03: qty 2

## 2017-07-03 MED ORDER — SIMETHICONE 80 MG PO CHEW: 80.0000 mg | CHEWABLE_TABLET | ORAL | Status: DC | PRN

## 2017-07-03 NOTE — Anesthesia Preprocedure Evaluation (Signed)

## 2017-07-03 NOTE — Anesthesia Pain Management Evaluation Note (Signed)
  [facility] Pain Management Visit Note  Patient: Teresa Johnson, 22 y.o., female  "Hello I am a member of the anesthesia team at [facility]. We have an anesthesia team available at all times to provide care throughout the hospital, including epidural management and anesthesia for C-section. I don't know your plan for the delivery whether it a natural birth, water birth, IV sedation, nitrous supplementation, doula or epidural, but we want to meet your pain goals."   1.Was your pain managed to your expectations on prior hospitalizations?   Yes   2.What is your expectation for pain management during this hospitalization?     Epidural  3.How can we help you reach that goal? epidural  Record the patient's initial score and the patient's pain goal.   Pain: 0  Pain Goal: 4 [facility] wants you to be able to say your pain was always managed very well.  Nikolaj Geraghty 07/03/2017

## 2017-07-03 NOTE — Progress Notes (Signed)
Teresa Johnson is a 22 y.o. G1P0 at 1850w4d by ultrasound admitted for PROM  Subjective: Patient doing well. Hopeful for delivery soon.   Objective: BP 100/68   Pulse 80   Temp 97.6 F (36.4 C) (Oral)   Resp 18   Ht 5\' 1"  (1.549 m)   Wt 59 kg (130 lb)   LMP 10/06/2016   SpO2 99%   BMI 24.56 kg/m  No intake/output data recorded. No intake/output data recorded.  FHT:  FHR: 130 bpm, variability: moderate,  accelerations:  Present,  decelerations:  Present late. Improving with positional change  UC:   Irregular  SVE:   Dilation: 7.5 Effacement (%): 90 Station: -1 Exam by:: [employer]  Labs: Lab Results  Component Value Date   WBC 8.7 07/03/2017   HGB 12.2 07/03/2017   HCT 34.9 (L) 07/03/2017   MCV 89.0 07/03/2017   PLT 185 07/03/2017    Assessment / Plan: Augmentation of labor. S/p FB and Pitocin   Labor: Pit @ 4210miliunits/min, will continue to titrate up per protocol. Will attempt positional changes to improve FHT Fetal Wellbeing:  Category II Pain Control:  Epidural I/D:  n/a Anticipated MOD:  NSVD  Oralia ManisSherin Agnieszka Newhouse, DO PGY-1 07/03/2017, 4:21 PM

## 2017-07-03 NOTE — MAU Provider Note (Signed)
History   [id number]   Chief Complaint  Patient presents with  . Contractions    HPI Teresa Johnson is a 22 y.o. female  G1P0 @38 .4 wks here with report of feeing a pop then leaking fluid around 0100.  Leaking of fluid has continued. Has not needed to wear a pad. Pt reports q4 min contractions. She denies vaginal bleeding. She reports + fetal movement. All other systems negative.    Patient's last menstrual period was 10/06/2016.  OB History  Gravida Para Term Preterm AB Living  1         0  SAB TAB Ectopic Multiple Live Births               # Outcome Date GA Lbr Len/2nd Weight Sex Delivery Anes PTL Lv  1 Current               Past Medical History:  Diagnosis Date  . Medical history non-contributory     Family History  Problem Relation Age of Onset  . Diabetes Mother     Social History   Socioeconomic History  . Marital status: Single    Spouse name: None  . Number of children: None  . Years of education: None  . Highest education level: None  Social Needs  . Financial resource strain: None  . Food insecurity - worry: None  . Food insecurity - inability: None  . Transportation needs - medical: None  . Transportation needs - non-medical: None  Occupational History  . None  Tobacco Use  . Smoking status: Never Smoker  . Smokeless tobacco: Never Used  Substance and Sexual Activity  . Alcohol use: No  . Drug use: Yes    Types: Marijuana  . Sexual activity: Yes    Birth control/protection: Condom  Other Topics Concern  . None  Social History Narrative  . None    No Known Allergies  No current facility-administered medications on file prior to encounter.    Current Outpatient Medications on File Prior to Encounter  Medication Sig Dispense Refill  . ondansetron (ZOFRAN) 8 MG tablet Take 1 tablet (8 mg total) by mouth every 8 (eight) hours as needed for nausea or vomiting. 40 tablet 2  . Prenat-FeAsp-Meth-FA-DHA w/o A (PRENATE PIXIE) 10-0.6-0.4-200  MG CAPS Take 1 tablet by mouth daily. 30 capsule 12  . terconazole (TERAZOL 7) 0.4 % vaginal cream Place 1 applicator vaginally at bedtime. Use for seven days 45 g 0     Review of Systems  Gastrointestinal: Positive for abdominal pain (ctx).  Genitourinary: Positive for vaginal discharge. Negative for vaginal bleeding.     Physical Exam   Vitals:   07/03/17 0428 07/03/17 0515  BP: 100/70   Pulse: 87   Resp: 16   Temp: 98.6 F (37 C)   TempSrc: Oral   SpO2: 95% 95%  Weight: 130 lb (59 kg)   Height: 5\' 1"  (1.549 m)     Physical Exam  Constitutional: She is oriented to person, place, and time. She appears well-developed and well-nourished. No distress.  HENT:  Head: Normocephalic and atraumatic.  Neck: Normal range of motion.  Cardiovascular: Normal rate.  Respiratory: Effort normal. No respiratory distress.  Genitourinary:  Genitourinary Comments: SSE: +mucous, no pool, fern neg  Musculoskeletal: Normal range of motion.  Neurological: She is alert and oriented to person, place, and time.  Skin: Skin is warm and dry.  Psychiatric: She has a normal mood and affect.  EFM: 125 bpm,  mod variability, + accels, no decels Toco: 3-5  Results for orders placed or performed during the hospital encounter of 07/03/17 (from the past 24 hour(s))  Amnisure rupture of membrane (rom)not [facility]     Status: None   Collection Time: 07/03/17  5:45 AM  Result Value Ref Range   Amnisure ROM POSITIVE    MAU Course  Procedures  MDM Labs ordered and reviewed. Plan for admit.  Assessment and Plan  38.[redacted] wks gestation SROM [facility] labor team   Donette LarryBhambri, Elizaveta Mattice, [place] 07/03/2017 5:24 AM

## 2017-07-03 NOTE — Plan of Care (Signed)
Progressing appropriately. Admission completed. Encouraged to call for assistance with feeding as needed and for [facility].

## 2017-07-03 NOTE — Progress Notes (Signed)
Teresa DonningKieara Johnson is a 22 y.o. G1P0 at 7274w4d by ultrasound admitted for PROM  Subjective: Patient doing well. Can feel contractions more strongly and would like epidural   Objective: BP 99/70   Pulse 83   Temp 97.8 F (36.6 C) (Oral)   Resp 18   Ht 5\' 1"  (1.549 m)   Wt 59 kg (130 lb)   LMP 10/06/2016   SpO2 100%   BMI 24.56 kg/m  No intake/output data recorded. No intake/output data recorded.  FHT:  FHR: 130 bpm, variability: moderate,  accelerations:  Present,  decelerations:  Absent UC:   irregular, every 1-3 minutes SVE:   Dilation: 5 Effacement (%): 80 Station: -1 Exam by:: Foye ClockS. Oklesh RN  Labs: Lab Results  Component Value Date   WBC 8.7 07/03/2017   HGB 12.2 07/03/2017   HCT 34.9 (L) 07/03/2017   MCV 89.0 07/03/2017   PLT 185 07/03/2017    Assessment / Plan: Augmentation of labor. S/p FB and Pitocin   Labor: Pit @ 836miliunits/min, will continue to titrate up per protocol  Fetal Wellbeing:  Category I Pain Control:  Epidural planned I/D:  n/a Anticipated MOD:  NSVD  Oralia ManisSherin Gennaro Lizotte, DO PGY-1 07/03/2017, 1:44 PM

## 2017-07-03 NOTE — Anesthesia Procedure Notes (Signed)
Epidural Patient location during procedure: OB  Staffing Anesthesiologist: Cliff Damiani, MD Performed: anesthesiologist   Preanesthetic Checklist Completed: patient identified, site marked, surgical consent, pre-op evaluation, timeout performed, IV checked, risks and benefits discussed and monitors and equipment checked  Epidural Patient position: sitting Prep: DuraPrep Patient monitoring: heart rate, continuous pulse ox and blood pressure Approach: right paramedian Location: L3-L4 Injection technique: LOR saline  Needle:  Needle type: Tuohy  Needle gauge: 17 G Needle length: 9 cm and 9 Needle insertion depth: 6 cm Catheter type: closed end flexible Catheter size: 20 Guage Catheter at skin depth: 10 cm Test dose: negative  Assessment Events: blood not aspirated, injection not painful, no injection resistance, negative IV test and no paresthesia  Additional Notes Patient identified. Risks/Benefits/Options discussed with patient including but not limited to bleeding, infection, nerve damage, paralysis, failed block, incomplete pain control, headache, blood pressure changes, nausea, vomiting, reactions to medication both or allergic, itching and postpartum back pain. Confirmed with bedside nurse the patient's most recent platelet count. Confirmed with patient that they are not currently taking any anticoagulation, have any bleeding history or any family history of bleeding disorders. Patient expressed understanding and wished to proceed. All questions were answered. Sterile technique was used throughout the entire procedure. Please see nursing notes for vital signs. Test dose was given through epidural needle and negative prior to continuing to dose epidural or start infusion. Warning signs of high block given to the patient including shortness of breath, tingling/numbness in hands, complete motor block, or any concerning symptoms with instructions to call for help. Patient was given  instructions on fall risk and not to get out of bed. All questions and concerns addressed with instructions to call with any issues.     

## 2017-07-03 NOTE — Progress Notes (Addendum)
Tempie DonningKieara Magnan a 22 year old patient presented to MAU with c/o abdominal contraction with lower back pain  a score 9/10. Patient stated that the contraction and back pain started at approximately 0100 AM today. She came here at this time because the contractions have  increasingly gotten worst and she also began leaking.   0430: EFM initiated FHR 135  0520: Provider at bedside.   [id number] Mayo: PCOT Fern Test   [id number]: Amnisure Test  [phone]: Provider at bedside. Pt is updated and will be admitted  0730: PIV inserted, IV Fluids started and labs drawn  0738: Pt transferred to L&D

## 2017-07-03 NOTE — H&P (Signed)
Obstetric History and Physical  Teresa DonningKieara Johnson is a 22 y.o. G1P0 with IUP at 3348w4d presenting for SROM ~0100. Patient states she has been having  irregular, every 10 minutes contractions, none vaginal bleeding, ruptured, clear fluid membranes, with active fetal movement.  No blurry vision, headaches or peripheral edema, and RUQ pain.   Prenatal Course Source of Care: [facility]  with onset of care at 15 weeks Dating: By LMP --->  Estimated Date of Delivery: 07/13/17 Pregnancy complications or risks: Patient Active Problem List   Diagnosis Date Noted  . Indication for care in labor or delivery 07/03/2017  . Maternal varicella, non-immune 02/04/2017  . Late prenatal care 01/26/2017  . Supervision of normal first pregnancy, antepartum 01/25/2017   She plans to breastfeed She desires Depo-Provera for postpartum contraception.   Sono:    @[redacted]w[redacted]d , CWD, normal anatomy, cephalic presentation, fundal placenta, 2545g, 49% EFW, normal AFI  Prenatal labs and studies: ABO, Rh: AB/Positive/-- (09/05 1551) Antibody: Negative (09/05 1551) Rubella: 1.04 (09/05 1551) RPR: Non Reactive (11/28 1103)  HBsAg: Negative (09/05 1551)  HIV: Non Reactive (11/28 1103)  ZOX:WRUEAVWUGBS:Negative (01/23 1151) 2 hr Glucola  normal Genetic screening normal Anatomy US normal  Prenatal Transfer Tool  Maternal Diabetes: No Genetic Screening: Normal Maternal Ultrasounds/Referrals: Normal Fetal Ultrasounds or other Referrals:  None Maternal Substance Abuse:  No Significant Maternal Medications:  None Significant Maternal Lab Results: Lab values include: Group B Strep negative  Past Medical History:  Diagnosis Date  . Medical history non-contributory     Past Surgical History:  Procedure Laterality Date  . NO PAST SURGERIES      OB History  Gravida Para Term Preterm AB Living  1         0  SAB TAB Ectopic Multiple Live Births               # Outcome Date GA Lbr Len/2nd Weight Sex Delivery Anes PTL Lv  1 Current                Social History   Socioeconomic History  . Marital status: Single    Spouse name: None  . Number of children: None  . Years of education: None  . Highest education level: None  Social Needs  . Financial resource strain: None  . Food insecurity - worry: None  . Food insecurity - inability: None  . Transportation needs - medical: None  . Transportation needs - non-medical: None  Occupational History  . None  Tobacco Use  . Smoking status: Never Smoker  . Smokeless tobacco: Never Used  Substance and Sexual Activity  . Alcohol use: No  . Drug use: Yes    Types: Marijuana  . Sexual activity: Yes    Birth control/protection: Condom  Other Topics Concern  . None  Social History Narrative  . None    Family History  Problem Relation Age of Onset  . Diabetes Mother     Medications Prior to Admission  Medication Sig Dispense Refill Last Dose  . ondansetron (ZOFRAN) 8 MG tablet Take 1 tablet (8 mg total) by mouth every 8 (eight) hours as needed for nausea or vomiting. 40 tablet 2 05/23/2017  . Prenat-FeAsp-Meth-FA-DHA w/o A (PRENATE PIXIE) 10-0.6-0.4-200 MG CAPS Take 1 tablet by mouth daily. 30 capsule 12 07/02/2017 at 0800  . terconazole (TERAZOL 7) 0.4 % vaginal cream Place 1 applicator vaginally at bedtime. Use for seven days 45 g 0 Taking    No Known Allergies  Review  of Systems: Negative except for what is mentioned in HPI.  Physical Exam: BP (!) 90/56   Pulse 96   Temp 97.8 F (36.6 C) (Oral)   Resp 18   Ht 5\' 1"  (1.549 m)   Wt 59 kg (130 lb)   LMP 10/06/2016   SpO2 100%   BMI 24.56 kg/m  CONSTITUTIONAL: Well-developed, well-nourished female in no acute distress.  HENT:  Normocephalic, atraumatic, External right and left ear normal. Oropharynx is clear and moist EYES: Conjunctivae and EOM are normal. Pupils are equal, round, and reactive to light. No scleral icterus.  NECK: Normal range of motion, supple, no masses SKIN: Skin is warm and dry. No  rash noted. Not diaphoretic. No erythema. No pallor. NEUROLOGIC: Alert and oriented to person, place, and time. Normal reflexes, muscle tone coordination. No cranial nerve deficit noted. PSYCHIATRIC: Normal mood and affect. Normal behavior. Normal judgment and thought content. CARDIOVASCULAR: Normal heart rate noted, regular rhythm RESPIRATORY: Effort and breath sounds normal, no problems with respiration noted ABDOMEN: Soft, nontender, nondistended, gravid. MUSCULOSKELETAL: Normal range of motion. No edema and no tenderness. 2+ distal pulses.  Cervical Exam: Dilation: 2 Effacement (%): 80 Station: -1 Presentation: Vertex Exam by:: Teresa Johnson   FHT:  Baseline rate 135 bpm   Variability moderate  Accelerations present   Decelerations none Contractions: Every 4-8 mins   Pertinent Labs/Studies:   Results for orders placed or performed during the hospital encounter of 07/03/17 (from the past 24 hour(s))  Amnisure rupture of membrane (rom)not at [facility]     Status: None   Collection Time: 07/03/17  5:45 AM  Result Value Ref Range   Amnisure ROM POSITIVE   CBC     Status: Abnormal   Collection Time: 07/03/17  7:30 AM  Result Value Ref Range   WBC 8.7 4.0 - 10.5 K/uL   RBC 3.92 3.87 - 5.11 MIL/uL   Hemoglobin 12.2 12.0 - 15.0 g/dL   HCT 13.0 (L) 86.5 - 78.4 %   MCV 89.0 78.0 - 100.0 fL   MCH 31.1 26.0 - 34.0 pg   MCHC 35.0 30.0 - 36.0 g/dL   RDW 69.6 29.5 - 28.4 %   Platelets 185 150 - 400 K/uL    Assessment : Teresa Johnson is a 22 y.o. G1P0 at [redacted]w[redacted]d being admitted for SROM ~0100 with onset of early labor.  Plan: Labor: Augmentation of labor with foley bulb. Can further augment as needed with pitocin. Expectant management. Analgesia as needed. FWB: Reassuring fetal heart tracing.   GBS negative Delivery plan: Hopeful for vaginal delivery   Caryl Ada, DO OB Fellow Faculty Practice, [facility] 07/03/2017, 8:51 AM

## 2017-07-04 NOTE — Lactation Note (Signed)
This note was copied from a baby's chart. Lactation Consultation Note  Patient Name: Teresa Johnson NWGNF'AToday's Date: 07/04/2017 Reason for consult: Initial assessment;Primapara;Early term 37-38.6wks Breastfeeding consultation services and support information given and reviewed.  Newborn is now 3016 hours old and latching well per mom.  Discussed cluster feeding and instructed to feed with cues.  Mom denies questions/concerns.  Encouraged to call for assist prn.  Maternal Data Does the patient have breastfeeding experience prior to this delivery?: No  Feeding    LATCH Score                   Interventions    Lactation Tools Discussed/Used     Consult Status Consult Status: Follow-up Date: 07/05/17 Follow-up type: In-patient    Huston FoleyMOULDEN, Bray Vickerman S 07/04/2017, 9:20 AM

## 2017-07-04 NOTE — Anesthesia Postprocedure Evaluation (Signed)
Anesthesia Post Note  Patient: Teresa Johnson  Procedure(s) Performed: AN AD HOC LABOR EPIDURAL     Patient location during evaluation: Mother Baby Anesthesia Type: Epidural Level of consciousness: awake and alert Pain management: pain level controlled Vital Signs Assessment: post-procedure vital signs reviewed and stable Respiratory status: spontaneous breathing, nonlabored ventilation and respiratory function stable Cardiovascular status: stable Postop Assessment: no headache, no backache and epidural receding Anesthetic complications: no    Last Vitals:  Vitals:   07/04/17 0021 07/04/17 0545  BP: 108/65 (!) 92/58  Pulse: 64 73  Resp: 16 16  Temp: 36.9 C 36.7 C  SpO2:      Last Pain:  Vitals:   07/04/17 0545  TempSrc: Oral  PainSc:    Pain Goal: Patients Stated Pain Goal: 0 (07/03/17 0452)               Marrion CoyMERRITT,Danique Hartsough

## 2017-07-04 NOTE — Progress Notes (Signed)
CSW received consult for hx of marijuana use.  Referral was screened out due to the following: °~[name] had no documented substance use after initial prenatal visit/+UPT. °~[name] had no positive drug screens after initial prenatal visit/+UPT. °~Baby's UDS is negative. ° °Please consult CSW if current concerns arise or by [name]'s request. ° °CSW will monitor CDS results and make report to Child Protective Services if warranted. ° °Shamera Yarberry Boyd-Gilyard, MSW, LCSW °Clinical Social Work °[phone] ° °

## 2017-07-04 NOTE — Progress Notes (Signed)
Post Partum Day 1 Subjective: no complaints, up ad lib, tolerating PO and + flatus  Objective: Blood pressure (!) 92/58, pulse 73, temperature 98 F (36.7 C), temperature source Oral, resp. rate 16, height 5\' 1"  (1.549 m), weight 59 kg (130 lb), last menstrual period 10/06/2016, SpO2 99 %, unknown if currently breastfeeding.  Physical Exam:  General: alert, cooperative and no distress Lochia: appropriate Uterine Fundus: firm DVT Evaluation: No evidence of DVT seen on physical exam. No cords or calf tenderness. No significant calf/ankle edema.  Recent Labs    07/03/17 0730  HGB 12.2  HCT 34.9*    Assessment/Plan: Plan for discharge tomorrow, Breastfeeding and Contraception depo   LOS: 1 day   Oralia ManisSherin Geoffrey Hynes, DO PGY-1 07/04/2017, 7:42 AM

## 2017-07-05 ENCOUNTER — Encounter: Payer: Medicaid Other | Admitting: Certified Nurse Midwife

## 2017-07-05 LAB — BIRTH TISSUE RECOVERY COLLECTION (PLACENTA DONATION)

## 2017-07-05 MED ORDER — IBUPROFEN 600 MG PO TABS: 600.0000 mg | ORAL_TABLET | Freq: Four times a day (QID) | ORAL | 0 refills | Status: DC

## 2017-07-05 NOTE — Lactation Note (Signed)
This note was copied from a baby's chart. Lactation Consultation Note  Patient Name: Girl Tempie DonningKieara Kerchner KGMWN'UToday's Date: 07/05/2017  Pediatrician ordered formula supplementation due to 8% weight loss.  Instructed to continue to first put baby to breast with feeding cues and supplement after with expressed milk or formula per volume parameters.  Mom has a pump at home.  Instructed to post pump every 3 hours for stimulation.  Discussed milk coming to volume and engorgement treatment.  Lactation outpatient services reviewed and encouraged prn.   Maternal Data    Feeding Feeding Type: Bottle Fed - Formula Nipple Type: Slow - flow Length of feed: 5 min(sleepy)  LATCH Score                   Interventions    Lactation Tools Discussed/Used     Consult Status      Huston FoleyMOULDEN, Ramisa Duman S 07/05/2017, 9:11 AM

## 2017-07-05 NOTE — Discharge Summary (Signed)
OB Discharge Summary     Patient Name: Teresa Johnson DOB: 25-Feb-1996 MRN: [id number] Date of admission: 07/03/2017  Delivering MD: Oralia Manis )  Date of discharge: 07/05/2017    Admitting diagnosis: Spontaneous vaginal delivery after SROM Intrauterine pregnancy: [redacted]w[redacted]d    Secondary diagnosis:  Active Problems:   Patient Active Problem List   Diagnosis Date Noted  . Indication for care in labor or delivery 07/03/2017  . SVD (spontaneous vaginal delivery) 07/03/2017  . Maternal varicella, non-immune 02/04/2017  . Late prenatal care 01/26/2017  . Supervision of normal first pregnancy, antepartum 01/25/2017       Discharge diagnosis: Term Pregnancy Delivered                                                                                                Post partum procedures:none  Augmentation: Pitocin and Foley Balloon  Complications: None  Hospital course:  Onset of Labor With Vaginal Delivery     22 y.o. yo G1P1001 at [redacted]w[redacted]d was admitted in Latent Labor on 07/03/2017. Patient had an uncomplicated labor course as follows:  Membrane Rupture Time/Date: 1:00 AM ,07/03/2017   Intrapartum Procedures: Episiotomy: None [1]                                         Lacerations:  None [1]  Patient had a delivery of a Viable infant. 07/03/2017  Information for the patient's newborn:  Elliott, Quade [[id number]]       Pateint had an uncomplicated postpartum course.  She is ambulating, tolerating a regular diet, passing flatus, and urinating well. Patient is discharged home in stable condition on 07/05/17.   Physical exam  Vitals:   07/04/17 1819 07/05/17 0559  BP: (!) 100/54 (!) 98/51  Pulse: 70 68  Resp: 16 16  Temp: 98 F (36.7 C) 98 F (36.7 C)  SpO2:      General: alert and cooperative Lochia: appropriate Uterine Fundus: firm Incision: N/A DVT Evaluation: No evidence of DVT seen on physical exam.  Labs: Results for orders placed or performed during the  hospital encounter of 07/03/17 (from the past 24 hour(s))  Collect bld for placenta donatation     Status: None   Collection Time: 07/05/17  5:31 AM  Result Value Ref Range   Placenta donation bld collect COLLECTED BY LABORATORY      Discharge instruction: per After Visit Summary and "Baby and Me Booklet".  After visit meds:  No Known Allergies     Diet: routine diet  Activity: Advance as tolerated. Pelvic rest for 6 weeks.   Outpatient follow up:6 weeks Future Appointments:  Future Appointments  Date Time Provider Department Center  07/05/2017  3:00 PM Roe Coombs, CNM [facility] None  07/12/2017  2:45 PM Denney, Rachelle A, CNM [facility] None    Follow up Appt: No Follow-up on file.     Postpartum contraception: Depo Provera  Newborn Data: APGAR (1 MIN): 8   APGAR (5 MINS): 9   APGAR (10 MINS):   @  BABYWGTLBSEBC@   Baby Feeding: Breast Disposition:home with mother  Ames CoupeCharles A McLendon, Medical Student  07/05/2017   Midwife attestation I have seen and examined this patient and agree with above documentation in the medical student's note.   Tempie DonningKieara Reichenberger is a 22 y.o. G1P1001 pt of [facility] s/p NSVD.   Pain is well controlled.  Plan for birth control is LARC vs Depo, discussed at length today and questions answered..  Method of Feeding: breast  PE:  BP (!) 98/51 (BP Location: Left Arm)   Pulse 68   Temp 98 F (36.7 C) (Oral)   Resp 16   Ht 5\' 1"  (1.549 m)   Wt 130 lb (59 kg)   LMP 10/06/2016   SpO2 99%   Breastfeeding? Unknown   BMI 24.56 kg/m  Gen: well appearing Heart: reg rate Lungs: normal WOB Fundus firm Ext: soft, no pain, no edema  Recent Labs    07/03/17 0730  HGB 12.2  HCT 34.9*     Plan: discharge today - postpartum care discussed - f/u clinic in 6 weeks for postpartum visit   Sharen CounterLisa Leftwich-Kirby, CNM 2:25 PM

## 2017-07-06 ENCOUNTER — Ambulatory Visit: Payer: Self-pay

## 2017-07-06 NOTE — Lactation Note (Signed)
This note was copied from a baby's chart. Lactation Consultation Note  Patient Name: [name] ZOXWR'UToday's Date: 07/06/2017  Mom is putting baby to breast some and also supplementing with formula.  Breasts are full this AM and mom just pumped 60 mls.  She states she was planning on giving formula at next feeding.  Recommended she give baby her breast milk.  Reviewed storage guidelines.  Instructed to either put baby to breast or pump every 3 hours.  Mom has a pump at home.   Maternal Data    Feeding Feeding Type: Bottle Fed - Formula  LATCH Score                   Interventions    Lactation Tools Discussed/Used     Consult Status      Huston FoleyMOULDEN, Jaeven Wanzer S 07/06/2017, 9:25 AM

## 2017-07-12 ENCOUNTER — Encounter: Payer: Medicaid Other | Admitting: Certified Nurse Midwife

## 2017-07-25 NOTE — Progress Notes (Signed)
CSW made [place] CPS report for infant's positive CDS for THC.  Nehemie Casserly Boyd-Gilyard, MSW, LCSW Clinical Social Work [phone]   

## 2017-08-03 ENCOUNTER — Ambulatory Visit: Payer: Medicaid Other | Admitting: Certified Nurse Midwife

## 2017-08-04 ENCOUNTER — Ambulatory Visit (INDEPENDENT_AMBULATORY_CARE_PROVIDER_SITE_OTHER): Payer: Medicaid Other | Admitting: Certified Nurse Midwife

## 2017-08-04 ENCOUNTER — Encounter: Payer: Self-pay | Admitting: Certified Nurse Midwife

## 2017-08-04 DIAGNOSIS — Z3202 Encounter for pregnancy test, result negative: Secondary | ICD-10-CM | POA: Diagnosis not present

## 2017-08-04 DIAGNOSIS — Z3042 Encounter for surveillance of injectable contraceptive: Secondary | ICD-10-CM | POA: Diagnosis not present

## 2017-08-04 DIAGNOSIS — Z1389 Encounter for screening for other disorder: Secondary | ICD-10-CM

## 2017-08-04 LAB — POCT URINE PREGNANCY: Preg Test, Ur: NEGATIVE

## 2017-08-04 MED ORDER — MEDROXYPROGESTERONE ACETATE 150 MG/ML IM SUSP
150.0000 mg | Freq: Once | INTRAMUSCULAR | Status: AC
  Administered 2017-08-04: 150 mg via INTRAMUSCULAR

## 2017-08-04 NOTE — Patient Instructions (Signed)

## 2017-08-04 NOTE — Progress Notes (Signed)
Post Partum Exam  Teresa Johnson is a 22 y.o. 831P1001 female who presents for a postpartum visit. She is 4 weeks postpartum following a spontaneous vaginal delivery. I have fully reviewed the prenatal and intrapartum course. The delivery was at 284w4d gestational weeks.  Anesthesia: epidural. Postpartum course has been unremarkable. Baby's course has been unremarkable. Baby is feeding by bottle - Teresa Johnson.. Bleeding no bleeding. Bowel function is normal. Bladder function is normal. Patient is sexually active. Contraception method is condoms. Postpartum depression screening:neg. Office supply Depo given L upper outer quad w/o difficulty. Next Depo due Mar 31-Jun 14 tg, cma   Patient reports intercourse with no difficulty, she reports IC last week. She requests BC today in office. Questions on Depo vs Nexplanon.   The following portions of the patient's history were reviewed and updated as appropriate: allergies, current medications, past family history, past medical history, past social history, past surgical history and problem list.  Patient has not had PAP smear due to age.  Review of Systems Pertinent items noted in HPI and remainder of comprehensive ROS otherwise negative.    Objective:  unknown if currently breastfeeding.  General:  alert, cooperative and no distress   Breasts:  inspection negative, no nipple discharge or bleeding, no masses or nodularity palpable  Lungs: clear to auscultation bilaterally  Heart:  regular rate and rhythm, S1, S2 normal, no murmur, click, rub or gallop  Abdomen: soft, non-tender; bowel sounds normal; no masses,  no organomegaly   Vulva:  not evaluated  Vagina: not evaluated  Cervix:  not evaluated  Corpus: not examined  Adnexa:  not evaluated  Rectal Exam: Not performed.        Assessment:    Normal postpartum exam. Pap smear not done at today's visit.   Educated and discussed Depo vs. Nexplanon as birth control options. Patient agrees to  get Depo in office today and return in 3 months for Nexplanon due to IC since delivery and within the last week.   Plan:   1. Contraception: Depo-Provera injections- first dose on 08/04/17 2. Follow up in: 3 months for Nexplanon insertion or as needed.   Sharyon CableVeronica C Keosha Rossa, CNM 08/04/17, 10:25 AM

## 2017-09-27 ENCOUNTER — Ambulatory Visit: Payer: Medicaid Other | Admitting: Certified Nurse Midwife

## 2017-10-02 ENCOUNTER — Encounter: Payer: Self-pay | Admitting: Certified Nurse Midwife

## 2017-10-02 ENCOUNTER — Encounter: Payer: Self-pay | Admitting: *Deleted

## 2017-10-02 ENCOUNTER — Ambulatory Visit (INDEPENDENT_AMBULATORY_CARE_PROVIDER_SITE_OTHER): Payer: Medicaid Other | Admitting: Certified Nurse Midwife

## 2017-10-02 ENCOUNTER — Other Ambulatory Visit (HOSPITAL_COMMUNITY)
Admission: RE | Admit: 2017-10-02 | Discharge: 2017-10-02 | Disposition: A | Discharge status: Home / Self Care | Payer: Medicaid Other | Source: Ambulatory Visit | Attending: Certified Nurse Midwife | Admitting: Certified Nurse Midwife

## 2017-10-02 VITALS — BP 101/63 | HR 88 | Wt 122.6 lb

## 2017-10-02 DIAGNOSIS — N898 Other specified noninflammatory disorders of vagina: Secondary | ICD-10-CM | POA: Diagnosis not present

## 2017-10-02 NOTE — Progress Notes (Signed)
RGYN patient presents for a problem visit today.  CC: possible BV / yeast Infection sx's started on Thursday.

## 2017-10-02 NOTE — Progress Notes (Signed)
Patient ID: Teresa Johnson, female   DOB: [birth year], 22 y.o.   MRN: [id number]  Chief Complaint  Patient presents with  . Vaginitis    HPI Teresa Johnson is a 22 y.o. female.  Here for change in vaginal discharge.  Reports started last Thursday.  Denies any period like bleeding since mid March.  Is on Depo for contraception.  Is sexually active.  Desires STD vaginal screening as well.  Denies any abdominal pain or change in urinary/bowel problems.  Reports history of hemorrhoid.  Diet discussed.  OTC Colace recommended.  Uses TUCKS medicated wipes and OTC hemorrhoidal cream.  S/P Vaginal delivery 3 months ago.    HPI  Past Medical History:  Diagnosis Date  . Medical history non-contributory     Past Surgical History:  Procedure Laterality Date  . NO PAST SURGERIES      Family History  Problem Relation Age of Onset  . Diabetes Mother     Social History Social History   Tobacco Use  . Smoking status: Never Smoker  . Smokeless tobacco: Never Used  Substance Use Topics  . Alcohol use: No  . Drug use: Yes    Types: Marijuana    No Known Allergies  Current Outpatient Medications  Medication Sig Dispense Refill  . calcium carbonate (TUMS - DOSED IN MG ELEMENTAL CALCIUM) 500 MG chewable tablet Chew 1 tablet by mouth daily.     No current facility-administered medications for this visit.     Review of Systems Review of Systems Constitutional: negative for fatigue and weight loss Respiratory: negative for cough and wheezing Cardiovascular: negative for chest pain, fatigue and palpitations Gastrointestinal: negative for abdominal pain and change in bowel habits Genitourinary: + vaginal discharge Integument/breast: negative for nipple discharge Musculoskeletal:negative for myalgias Neurological: negative for gait problems and tremors Behavioral/Psych: negative for abusive relationship, depression Endocrine: negative for temperature intolerance      Blood pressure  101/63, pulse 88, weight 122 lb 9.6 oz (55.6 kg), last menstrual period 08/06/2017, unknown if currently breastfeeding.  Physical Exam Physical Exam General:   alert  Skin:   no rash or abnormalities  Lungs:   clear to auscultation bilaterally  Heart:   regular rate and rhythm, S1, S2 normal, no murmur, click, rub or gallop  Breasts:   deferred  Abdomen:  normal findings: no organomegaly, soft, non-tender and no hernia  Pelvis:  External genitalia: normal general appearance Urinary system: urethral meatus normal and bladder without fullness, nontender Vaginal: normal without tenderness, induration or masses     50% of 20 min visit spent on counseling and coordination of care.   Data Reviewed Previous medical hx, meds, labs  Assessment     1. Vaginal discharge    - Cervicovaginal ancillary only  2. Hx of hemorrhoids     Plan   Possible Anusol suppositories  Follow up as needed.

## 2017-10-03 LAB — CERVICOVAGINAL ANCILLARY ONLY
BACTERIAL VAGINITIS: POSITIVE — AB
CHLAMYDIA, DNA PROBE: NEGATIVE
Candida vaginitis: POSITIVE — AB
NEISSERIA GONORRHEA: NEGATIVE
Trichomonas: NEGATIVE

## 2017-10-04 ENCOUNTER — Other Ambulatory Visit: Payer: Self-pay | Admitting: Certified Nurse Midwife

## 2017-10-04 DIAGNOSIS — B3731 Acute candidiasis of vulva and vagina: Secondary | ICD-10-CM

## 2017-10-04 DIAGNOSIS — B9689 Other specified bacterial agents as the cause of diseases classified elsewhere: Secondary | ICD-10-CM

## 2017-10-04 DIAGNOSIS — N760 Acute vaginitis: Principal | ICD-10-CM

## 2017-10-04 DIAGNOSIS — B373 Candidiasis of vulva and vagina: Secondary | ICD-10-CM

## 2017-10-04 MED ORDER — METRONIDAZOLE 500 MG PO TABS
500.0000 mg | ORAL_TABLET | Freq: Two times a day (BID) | ORAL | 0 refills | Status: DC
Start: 2017-10-04 — End: 2019-03-26

## 2017-10-04 MED ORDER — FLUCONAZOLE 200 MG PO TABS: 200.0000 mg | ORAL_TABLET | Freq: Once | ORAL | 0 refills | Status: AC

## 2017-10-04 MED ORDER — TERCONAZOLE 0.8 % VA CREA: 1.0000 | TOPICAL_CREAM | Freq: Every day | VAGINAL | 0 refills | Status: DC

## 2017-10-25 ENCOUNTER — Ambulatory Visit (INDEPENDENT_AMBULATORY_CARE_PROVIDER_SITE_OTHER): Payer: Medicaid Other

## 2017-10-25 DIAGNOSIS — Z3042 Encounter for surveillance of injectable contraceptive: Secondary | ICD-10-CM | POA: Diagnosis not present

## 2017-10-25 MED ORDER — MEDROXYPROGESTERONE ACETATE 150 MG/ML IM SUSP
150.0000 mg | Freq: Once | INTRAMUSCULAR | Status: AC
  Administered 2017-10-25: 150 mg via INTRAMUSCULAR

## 2017-10-25 MED ORDER — MEDROXYPROGESTERONE ACETATE 150 MG/ML IM SUSP: 150.0000 mg | INTRAMUSCULAR | 3 refills | Status: DC

## 2017-10-25 NOTE — Progress Notes (Signed)
Agree with A & P. 

## 2017-10-25 NOTE — Progress Notes (Signed)
Pt here for depo shot. Pt is within her window. Inj given in RUOQ. Pt tolerated well. Next depo due 8/21-9/4 

## 2018-01-16 ENCOUNTER — Ambulatory Visit (INDEPENDENT_AMBULATORY_CARE_PROVIDER_SITE_OTHER): Payer: Medicaid Other

## 2018-01-16 DIAGNOSIS — Z3042 Encounter for surveillance of injectable contraceptive: Secondary | ICD-10-CM | POA: Diagnosis not present

## 2018-01-16 MED ORDER — MEDROXYPROGESTERONE ACETATE 150 MG/ML IM SUSP
150.0000 mg | Freq: Once | INTRAMUSCULAR | Status: AC
  Administered 2018-01-16: 150 mg via INTRAMUSCULAR

## 2018-01-16 NOTE — Progress Notes (Signed)
Nurse visit for pt supplied Depo. Pt is within her window. Depo given LUOQ w/o difficulty. Next Depo due Nov 12-26, pt agrees.

## 2018-01-25 ENCOUNTER — Encounter: Payer: Self-pay | Admitting: Family Medicine

## 2018-04-09 ENCOUNTER — Ambulatory Visit: Payer: Medicaid Other

## 2018-04-12 ENCOUNTER — Ambulatory Visit (INDEPENDENT_AMBULATORY_CARE_PROVIDER_SITE_OTHER): Payer: Medicaid Other

## 2018-04-12 DIAGNOSIS — Z3042 Encounter for surveillance of injectable contraceptive: Secondary | ICD-10-CM

## 2018-04-12 MED ORDER — MEDROXYPROGESTERONE ACETATE 150 MG/ML IM SUSP
150.0000 mg | Freq: Once | INTRAMUSCULAR | Status: AC
  Administered 2018-04-12: 150 mg via INTRAMUSCULAR

## 2018-04-12 NOTE — Progress Notes (Signed)
Pt presents for depo inj. Pt is within her window. Inj given in RUOQ. Pt tolerated well. Next depo due 2/5-2/19
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
## 2018-07-05 ENCOUNTER — Ambulatory Visit: Payer: Medicaid Other

## 2018-07-11 ENCOUNTER — Other Ambulatory Visit (HOSPITAL_COMMUNITY)
Admission: RE | Admit: 2018-07-11 | Discharge: 2018-07-11 | Disposition: A | Discharge status: Home / Self Care | Payer: Medicaid Other | Source: Ambulatory Visit | Attending: Obstetrics | Admitting: Obstetrics

## 2018-07-11 ENCOUNTER — Ambulatory Visit (INDEPENDENT_AMBULATORY_CARE_PROVIDER_SITE_OTHER): Payer: Medicaid Other

## 2018-07-11 DIAGNOSIS — B373 Candidiasis of vulva and vagina: Secondary | ICD-10-CM

## 2018-07-11 DIAGNOSIS — Z3042 Encounter for surveillance of injectable contraceptive: Secondary | ICD-10-CM

## 2018-07-11 DIAGNOSIS — B3731 Acute candidiasis of vulva and vagina: Secondary | ICD-10-CM

## 2018-07-11 MED ORDER — MEDROXYPROGESTERONE ACETATE 150 MG/ML IM SUSP
150.0000 mg | Freq: Once | INTRAMUSCULAR | Status: AC
  Administered 2018-07-11: 150 mg via INTRAMUSCULAR

## 2018-07-11 NOTE — Progress Notes (Signed)
Pt is here for depo injection, injection is on time. Injection given in LUOQ, pt tolerated well. Next injection due 5/7-5/21. Pt requested to do a self swab for yeast like symptoms. Self swab done and sent off, advised pt we would let her know about the results.

## 2018-07-12 LAB — CERVICOVAGINAL ANCILLARY ONLY
Bacterial vaginitis: NEGATIVE
Candida vaginitis: POSITIVE — AB
Chlamydia: NEGATIVE
Neisseria Gonorrhea: NEGATIVE
Trichomonas: NEGATIVE

## 2018-07-16 ENCOUNTER — Other Ambulatory Visit: Payer: Self-pay | Admitting: Certified Nurse Midwife

## 2018-07-16 DIAGNOSIS — B373 Candidiasis of vulva and vagina: Secondary | ICD-10-CM

## 2018-07-16 DIAGNOSIS — B3731 Acute candidiasis of vulva and vagina: Secondary | ICD-10-CM

## 2018-07-16 MED ORDER — FLUCONAZOLE 150 MG PO TABS: 150.0000 mg | ORAL_TABLET | Freq: Every day | ORAL | 1 refills | Status: DC

## 2018-10-01 ENCOUNTER — Other Ambulatory Visit: Payer: Self-pay

## 2018-10-01 ENCOUNTER — Ambulatory Visit (INDEPENDENT_AMBULATORY_CARE_PROVIDER_SITE_OTHER): Payer: Medicaid Other

## 2018-10-01 DIAGNOSIS — Z3042 Encounter for surveillance of injectable contraceptive: Secondary | ICD-10-CM

## 2018-10-01 MED ORDER — MEDROXYPROGESTERONE ACETATE 150 MG/ML IM SUSP
150.0000 mg | INTRAMUSCULAR | Status: DC
  Administered 2018-10-01 – 2019-03-26 (×3): 150 mg via INTRAMUSCULAR

## 2018-10-01 NOTE — Progress Notes (Signed)
Pt is in the office for depo, administered in RUOQ and pt tolerated well. Next due 7/27- 8/10. .. Administrations This Visit    medroxyPROGESTERone (DEPO-PROVERA) injection 150 mg    Admin Date 10/01/2018 Action Given Dose 150 mg Route Intramuscular Administered By Katrina Stack, RN

## 2018-10-02 NOTE — Progress Notes (Signed)
I have reviewed the chart and agree with nursing staff's documentation of this patient's encounter.  Catalina Antigua, MD 10/02/2018 9:15 AM

## 2018-12-27 ENCOUNTER — Ambulatory Visit: Payer: Medicaid Other

## 2018-12-31 ENCOUNTER — Ambulatory Visit (INDEPENDENT_AMBULATORY_CARE_PROVIDER_SITE_OTHER): Payer: Medicaid Other

## 2018-12-31 ENCOUNTER — Other Ambulatory Visit: Payer: Self-pay

## 2018-12-31 DIAGNOSIS — Z3042 Encounter for surveillance of injectable contraceptive: Secondary | ICD-10-CM | POA: Diagnosis not present

## 2018-12-31 MED ORDER — MEDROXYPROGESTERONE ACETATE 150 MG/ML IM SUSP: 150.0000 mg | INTRAMUSCULAR | 0 refills | Status: DC

## 2018-12-31 NOTE — Progress Notes (Signed)
Nurse visit for pt supply Depo Depo given RUOQ w/o difficulty Next Depo due Oct 26-Nov 9, pt agrees.

## 2019-02-11 IMAGING — US US OB COMP LESS 14 WK
1 series · 15 of 28 positions shown · non-contrast
Comparison: None.

CLINICAL DATA: Cramping

EXAM:
OBSTETRIC <14 WK US AND TRANSVAGINAL OB US
TECHNIQUE: Both transabdominal and transvaginal ultrasound examinations were
performed for complete evaluation of the gestation as well as the
maternal uterus, adnexal regions, and pelvic cul-de-sac.
Transvaginal technique was performed to assess early pregnancy.

[Series 1: us ob comp less 14 wk · 15 of 57 slices shown]
[im 1/57]
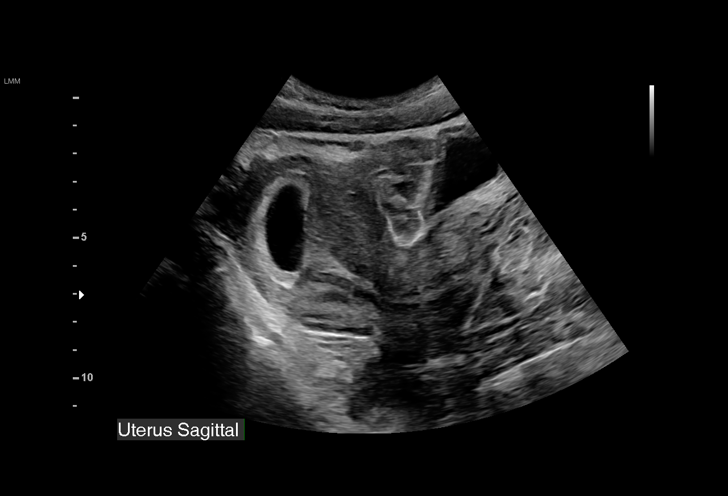
[im 5/57]
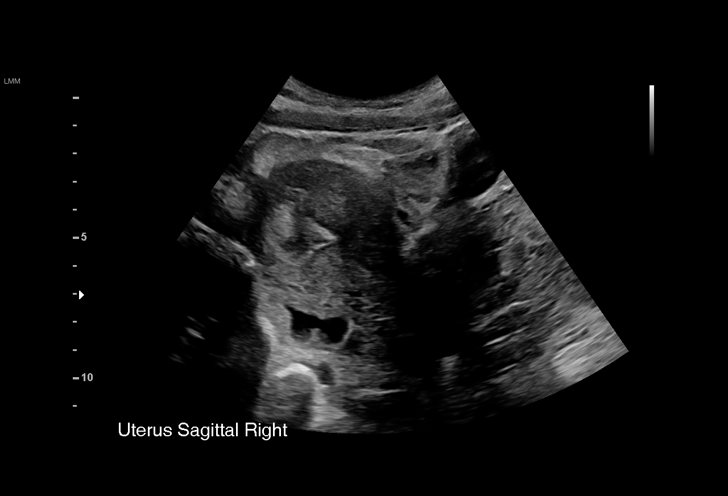
[im 9/57]
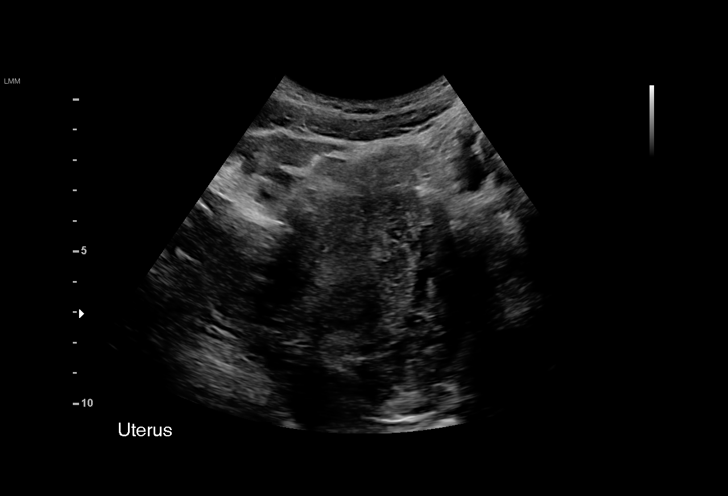
[im 13/57]
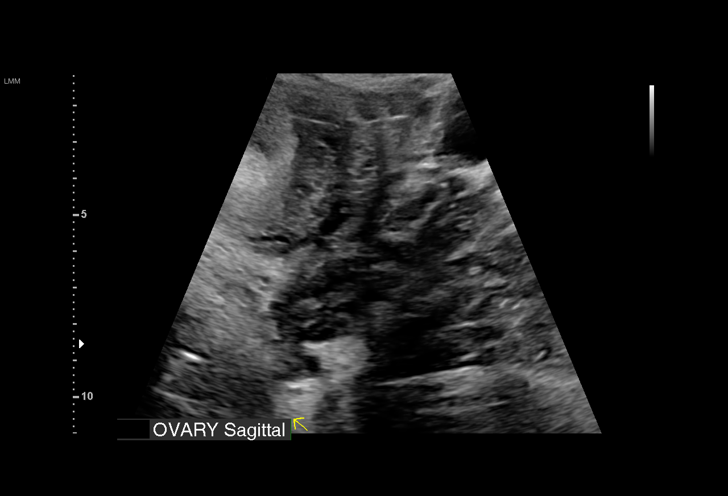
[im 17/57]
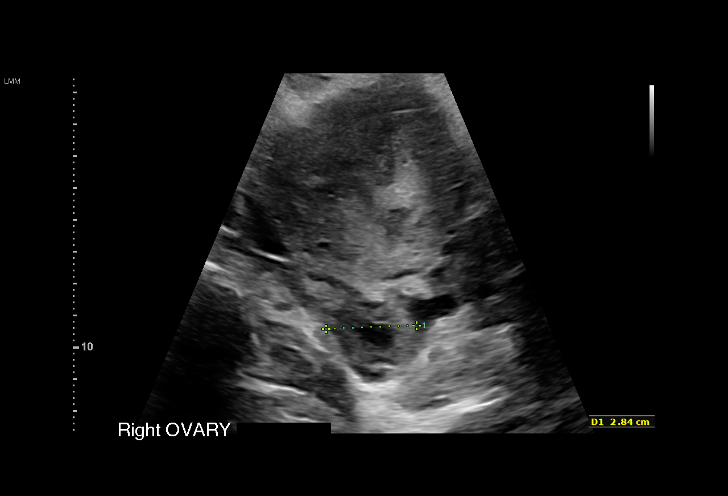
[im 21/57]
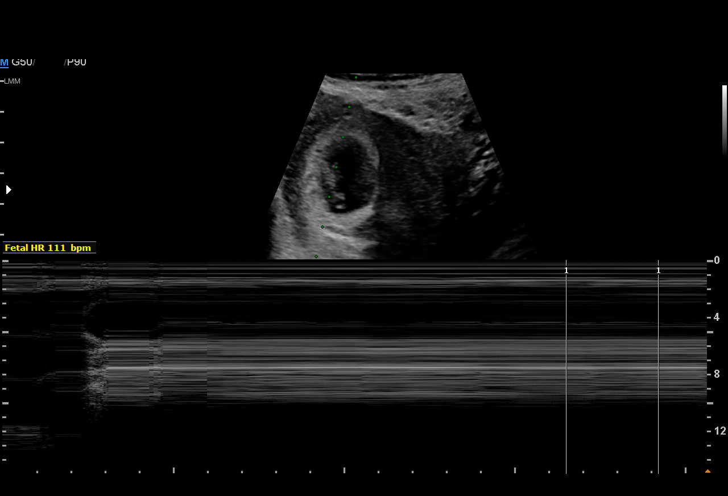
[im 25/57]
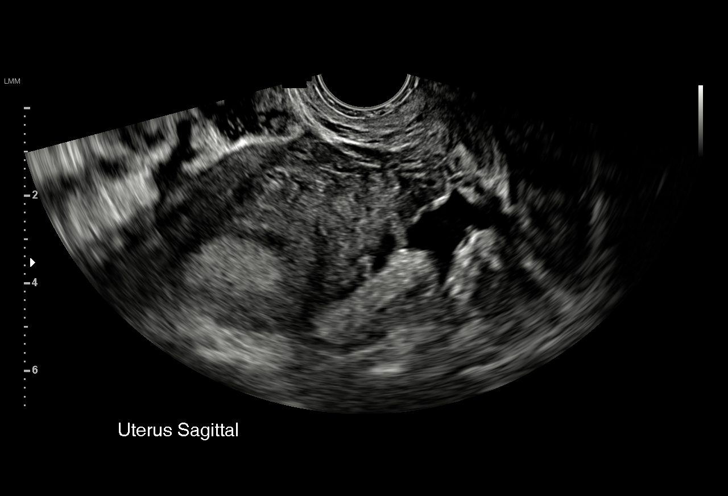
[im 30/57]
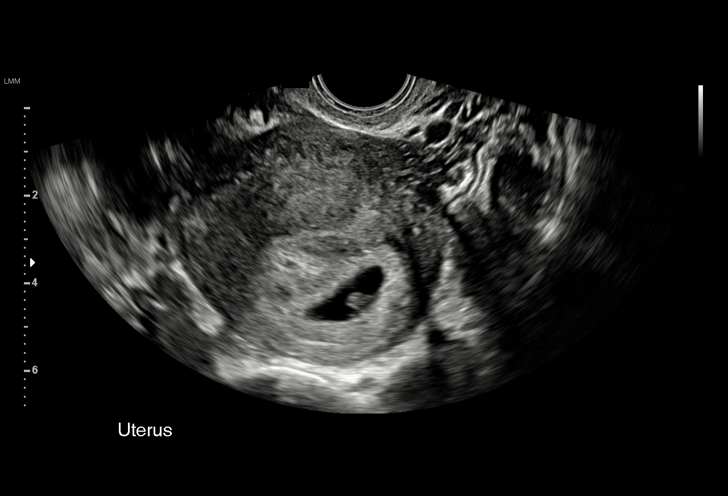
[im 32/57]
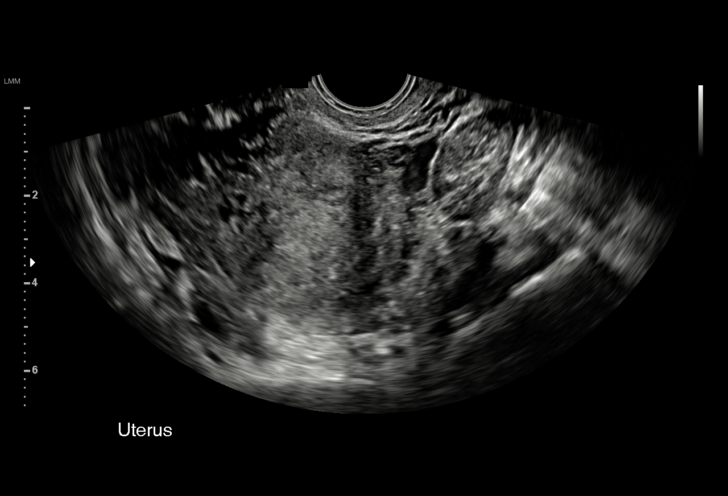
[im 36/57]
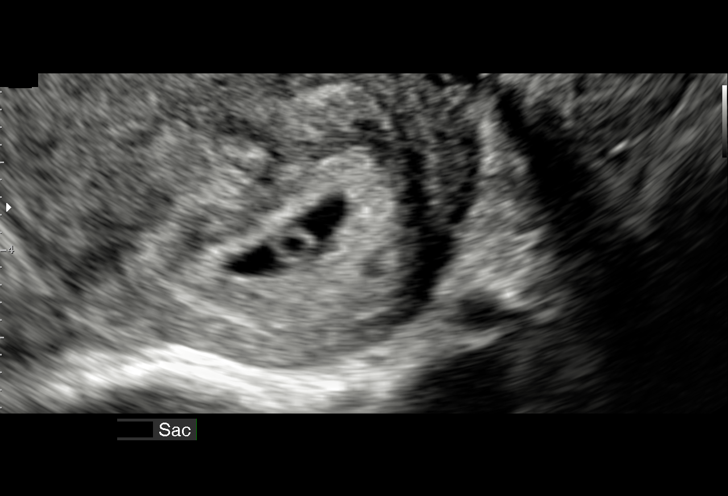
[im 40/57]
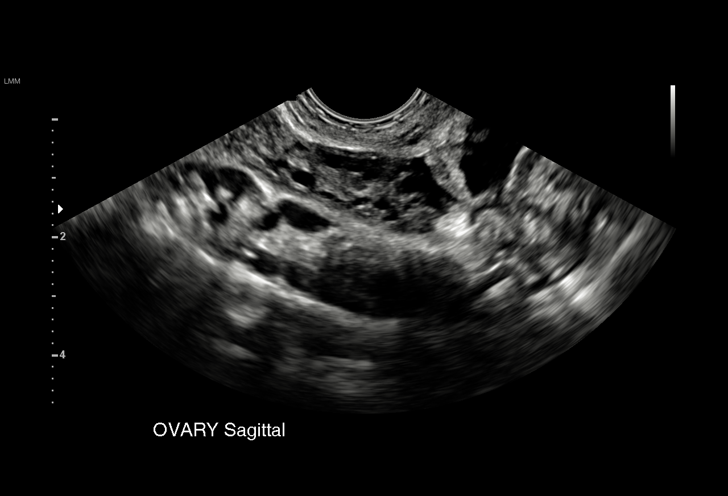
[im 44/57]
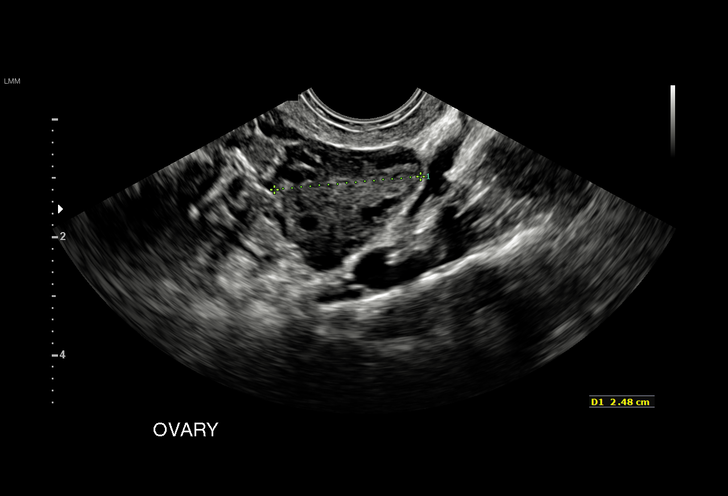
[im 48/57]
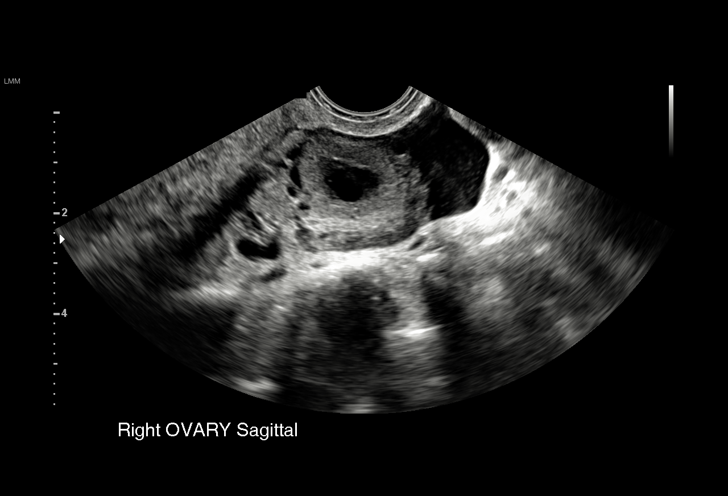
[im 52/57]
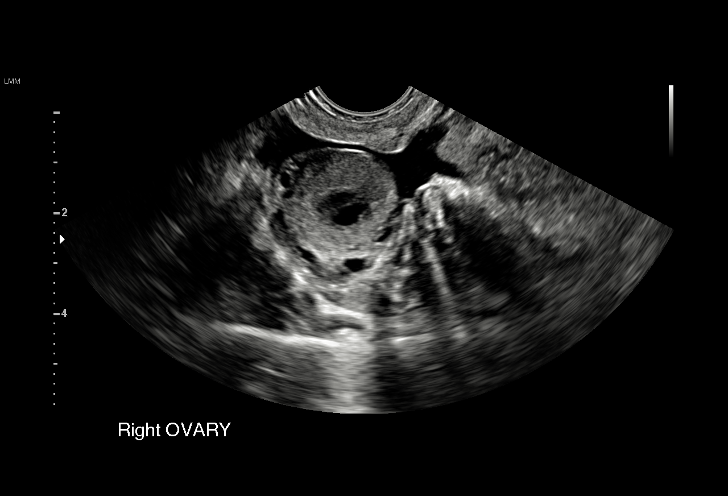
[im 57/57]
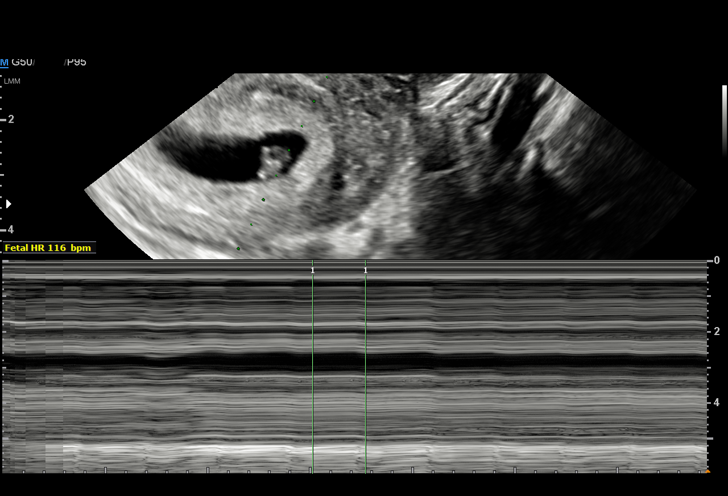

[15 of 28 positions shown; findings below may reference images not displayed]

FINDINGS: Intrauterine gestational sac: Single

Yolk sac:  Visualized

Embryo:  Visualized

Cardiac Activity: Visualized

Heart Rate: 116  bpm

MSD:   mm    w     d

CRL:  6.8  mm   6 w   3 d                  US EDC: 07/13/2017

Subchorionic hemorrhage:  None visualized.

Maternal uterus/adnexae: No adnexal mass. Small amount of free
fluid.
IMPRESSION: Six week 3 day intrauterine pregnancy. Fetal heart rate 116 beats
per minute. No acute maternal findings.

## 2019-03-18 ENCOUNTER — Other Ambulatory Visit: Payer: Self-pay | Admitting: Obstetrics

## 2019-03-26 ENCOUNTER — Other Ambulatory Visit: Payer: Self-pay

## 2019-03-26 ENCOUNTER — Ambulatory Visit (INDEPENDENT_AMBULATORY_CARE_PROVIDER_SITE_OTHER): Payer: Medicaid Other | Admitting: *Deleted

## 2019-03-26 DIAGNOSIS — Z3042 Encounter for surveillance of injectable contraceptive: Secondary | ICD-10-CM

## 2019-03-26 NOTE — Progress Notes (Signed)
Pt is in office for Depo injection.  Pt is on time for Depo. Injection given, pt tolerated well. Pt advised to RTO 1/19-06/25/2019 for next depo injection. Pt made aware she will need an AEX before next Depo to continue with Rx.   Pt states understanding.  Administrations This Visit    medroxyPROGESTERone (DEPO-PROVERA) injection 150 mg    Admin Date 03/26/2019 Action Given Dose 150 mg Route Intramuscular Administered By Valene Bors, CMA

## 2019-03-26 NOTE — Progress Notes (Signed)
I have reviewed this chart and agree with the RN/CMA assessment and management.    K. Meryl Omolola Mittman, M.D. Attending [facility] (Faculty Practice)   

## 2019-04-25 ENCOUNTER — Ambulatory Visit: Payer: Medicaid Other | Admitting: Obstetrics and Gynecology

## 2019-05-07 ENCOUNTER — Ambulatory Visit (INDEPENDENT_AMBULATORY_CARE_PROVIDER_SITE_OTHER): Payer: Medicaid Other | Admitting: Obstetrics

## 2019-05-07 ENCOUNTER — Other Ambulatory Visit (HOSPITAL_COMMUNITY)
Admission: RE | Admit: 2019-05-07 | Discharge: 2019-05-07 | Disposition: A | Discharge status: Home / Self Care | Payer: Medicaid Other | Source: Ambulatory Visit | Attending: Obstetrics and Gynecology | Admitting: Obstetrics and Gynecology

## 2019-05-07 ENCOUNTER — Other Ambulatory Visit: Payer: Self-pay

## 2019-05-07 ENCOUNTER — Encounter: Payer: Self-pay | Admitting: Obstetrics

## 2019-05-07 VITALS — Wt 115.0 lb

## 2019-05-07 DIAGNOSIS — Z01419 Encounter for gynecological examination (general) (routine) without abnormal findings: Secondary | ICD-10-CM

## 2019-05-07 DIAGNOSIS — Z130 Encounter for screening for diseases of the blood and blood-forming organs and certain disorders involving the immune mechanism: Secondary | ICD-10-CM | POA: Insufficient documentation

## 2019-05-07 DIAGNOSIS — Z0000 Encounter for general adult medical examination without abnormal findings: Secondary | ICD-10-CM

## 2019-05-08 ENCOUNTER — Other Ambulatory Visit: Payer: Self-pay | Admitting: Obstetrics

## 2019-05-08 DIAGNOSIS — B9689 Other specified bacterial agents as the cause of diseases classified elsewhere: Secondary | ICD-10-CM

## 2019-05-08 DIAGNOSIS — B379 Candidiasis, unspecified: Secondary | ICD-10-CM

## 2019-05-08 DIAGNOSIS — N760 Acute vaginitis: Secondary | ICD-10-CM

## 2019-05-08 LAB — CERVICOVAGINAL ANCILLARY ONLY
Bacterial Vaginitis (gardnerella): POSITIVE — AB
Candida Glabrata: NEGATIVE
Candida Vaginitis: POSITIVE — AB
Chlamydia: NEGATIVE
Comment: NEGATIVE
Comment: NEGATIVE
Comment: NEGATIVE
Comment: NEGATIVE
Comment: NEGATIVE
Comment: NORMAL
Neisseria Gonorrhea: NEGATIVE
Trichomonas: NEGATIVE

## 2019-05-08 LAB — CBC
Hematocrit: 37.6 % (ref 34.0–46.6)
Hemoglobin: 12.6 g/dL (ref 11.1–15.9)
MCH: 29.6 pg (ref 26.6–33.0)
MCHC: 33.5 g/dL (ref 31.5–35.7)
MCV: 89 fL (ref 79–97)
Platelets: 257 10*3/uL (ref 150–450)
RBC: 4.25 x10E6/uL (ref 3.77–5.28)
RDW: 12.3 % (ref 11.7–15.4)
WBC: 6.8 10*3/uL (ref 3.4–10.8)

## 2019-05-08 LAB — FERRITIN: Ferritin: 61 ng/mL (ref 15–150)

## 2019-05-08 MED ORDER — FLUCONAZOLE 150 MG PO TABS: 150.0000 mg | ORAL_TABLET | Freq: Once | ORAL | 0 refills | Status: AC

## 2019-05-08 MED ORDER — METRONIDAZOLE 500 MG PO TABS: 500.0000 mg | ORAL_TABLET | Freq: Two times a day (BID) | ORAL | 2 refills | Status: DC

## 2019-05-09 LAB — CYTOLOGY - PAP
Adequacy: ABSENT
Diagnosis: NEGATIVE
Diagnosis: REACTIVE

## 2019-05-13 ENCOUNTER — Encounter: Payer: Self-pay | Admitting: Obstetrics

## 2019-05-13 NOTE — Progress Notes (Signed)
Subjective:        Teresa Johnson is a 23 y.o. female here for a routine exam.  Current complaints: None.    Personal health questionnaire:  Is patient Ashkenazi Jewish, have a family history of breast and/or ovarian cancer: no Is there a family history of uterine cancer diagnosed at age < 1, gastrointestinal cancer, urinary tract cancer, family member who is a Field seismologist syndrome-associated carrier: no Is the patient overweight and hypertensive, family history of diabetes, personal history of gestational diabetes, preeclampsia or PCOS: no Is patient over 31, have PCOS,  family history of premature CHD under age 52, diabetes, smoke, have hypertension or peripheral artery disease:  no At any time, has a partner hit, kicked or otherwise hurt or frightened you?: no Over the past 2 weeks, have you felt down, depressed or hopeless?: no Over the past 2 weeks, have you felt little interest or pleasure in doing things?:no   Gynecologic History No LMP recorded. Contraception: Depo-Provera injections Last Pap: n/a. Results were: n/a Last mammogram: n/a. Results were: n/a  Obstetric History OB History  Gravida Para Term Preterm AB Living  1 1 1     1   SAB TAB Ectopic Multiple Live Births        0 1    # Outcome Date GA Lbr Len/2nd Weight Sex Delivery Anes PTL Lv  1 Term 07/03/17 [redacted]w[redacted]d 07:51 / 00:22 6 lb 1.5 oz (2.765 kg) F Vag-Spont EPI  LIV    Past Medical History:  Diagnosis Date  . Medical history non-contributory     Past Surgical History:  Procedure Laterality Date  . NO PAST SURGERIES       Current Outpatient Medications:  .  calcium carbonate (TUMS - DOSED IN MG ELEMENTAL CALCIUM) 500 MG chewable tablet, Chew 1 tablet by mouth daily., Disp: , Rfl:  .  medroxyPROGESTERone (DEPO-PROVERA) 150 MG/ML injection, Inject 1 mL (150 mg total) into the muscle every 3 (three) months., Disp: 1 mL, Rfl: 3 .  medroxyPROGESTERone (DEPO-PROVERA) 150 MG/ML injection, INJECT 1 ML (150 MG  TOTAL) INTO THE MUSCLE EVERY 3 (THREE) MONTHS., Disp: 1 mL, Rfl: 0 .  metroNIDAZOLE (FLAGYL) 500 MG tablet, Take 1 tablet (500 mg total) by mouth 2 (two) times daily., Disp: 14 tablet, Rfl: 2  Current Facility-Administered Medications:  .  medroxyPROGESTERone (DEPO-PROVERA) injection 150 mg, 150 mg, Intramuscular, Q90 days, Shelly Bombard, MD, 150 mg at 03/26/19 1528 No Known Allergies  Social History   Tobacco Use  . Smoking status: Never Smoker  . Smokeless tobacco: Never Used  Substance Use Topics  . Alcohol use: No    Family History  Problem Relation Age of Onset  . Diabetes Mother       Review of Systems  Constitutional: negative for fatigue and weight loss Respiratory: negative for cough and wheezing Cardiovascular: negative for chest pain, fatigue and palpitations Gastrointestinal: negative for abdominal pain and change in bowel habits Musculoskeletal:negative for myalgias Neurological: negative for gait problems and tremors Behavioral/Psych: negative for abusive relationship, depression Endocrine: negative for temperature intolerance    Genitourinary:negative for abnormal menstrual periods, genital lesions, hot flashes, sexual problems and vaginal discharge Integument/breast: negative for breast lump, breast tenderness, nipple discharge and skin lesion(s)    Objective:       Wt 115 lb (52.2 kg)   BMI 21.73 kg/m  General:   alert  Skin:   no rash or abnormalities  Lungs:   clear to auscultation bilaterally  Heart:  regular rate and rhythm, S1, S2 normal, no murmur, click, rub or gallop  Breasts:   normal without suspicious masses, skin or nipple changes or axillary nodes  Abdomen:  normal findings: no organomegaly, soft, non-tender and no hernia  Pelvis:  External genitalia: normal general appearance Urinary system: urethral meatus normal and bladder without fullness, nontender Vaginal: normal without tenderness, induration or masses Cervix: normal  appearance Adnexa: normal bimanual exam Uterus: anteverted and non-tender, normal size   Lab Review Urine pregnancy test Labs reviewed yes Radiologic studies reviewed no  50% of 15 min visit spent on counseling and coordination of care.   Assessment:     1. Encounter for routine gynecological examination with Papanicolaou smear of cervix  2. Screening for iron deficiency anemia Rx: - CBC - Ferritin - Cytology - PAP( [place]) - Cervicovaginal ancillary only( [place])   Plan:    Education reviewed: calcium supplements, depression evaluation, low fat, low cholesterol diet, safe sex/STD prevention, self breast exams and weight bearing exercise. Contraception: Depo-Provera injections. Follow up in: 1 year.     No orders of the defined types were placed in this encounter.  Orders Placed This Encounter  Procedures  . CBC  . Ferritin    Brock Bad, MD 05/13/2019 5:37 AM

## 2019-06-17 ENCOUNTER — Other Ambulatory Visit: Payer: Self-pay | Admitting: Obstetrics

## 2019-06-17 ENCOUNTER — Other Ambulatory Visit: Payer: Self-pay

## 2019-06-17 ENCOUNTER — Ambulatory Visit: Payer: Medicaid Other

## 2019-06-17 MED ORDER — MEDROXYPROGESTERONE ACETATE 150 MG/ML IM SUSP: 150.0000 mg | INTRAMUSCULAR | 0 refills | Status: DC

## 2019-06-19 ENCOUNTER — Ambulatory Visit: Payer: Medicaid Other

## 2019-06-24 ENCOUNTER — Ambulatory Visit: Payer: Medicaid Other

## 2019-06-25 ENCOUNTER — Other Ambulatory Visit: Payer: Self-pay

## 2019-06-25 ENCOUNTER — Ambulatory Visit (INDEPENDENT_AMBULATORY_CARE_PROVIDER_SITE_OTHER): Payer: Medicaid Other

## 2019-06-25 VITALS — BP 95/60 | HR 94 | Ht 61.0 in | Wt 116.0 lb

## 2019-06-25 DIAGNOSIS — Z3042 Encounter for surveillance of injectable contraceptive: Secondary | ICD-10-CM | POA: Diagnosis not present

## 2019-06-25 MED ORDER — MEDROXYPROGESTERONE ACETATE 150 MG/ML IM SUSP
150.0000 mg | Freq: Once | INTRAMUSCULAR | Status: AC
  Administered 2019-06-25: 16:00:00 150 mg via INTRAMUSCULAR

## 2019-06-25 NOTE — Progress Notes (Signed)
GYN presents for DEPO, given in RUOQ, tolerated well.  Next DEPO 04/20-09/24/2019.   Patient desires to switch to Nexplanon as it would be more convenient to her work schedule.  Administrations This Visit    medroxyPROGESTERone (DEPO-PROVERA) injection 150 mg    Admin Date 06/25/2019 Action Given Dose 150 mg Route Intramuscular Administered By Maretta Bees, RMA

## 2019-07-29 ENCOUNTER — Ambulatory Visit: Payer: Medicaid Other | Admitting: Obstetrics

## 2019-08-12 ENCOUNTER — Other Ambulatory Visit: Payer: Self-pay

## 2019-08-12 ENCOUNTER — Other Ambulatory Visit (HOSPITAL_COMMUNITY)
Admission: RE | Admit: 2019-08-12 | Discharge: 2019-08-12 | Disposition: A | Discharge status: Home / Self Care | Payer: Medicaid Other | Source: Ambulatory Visit | Attending: Advanced Practice Midwife | Admitting: Advanced Practice Midwife

## 2019-08-12 ENCOUNTER — Encounter: Payer: Self-pay | Admitting: Obstetrics

## 2019-08-12 ENCOUNTER — Ambulatory Visit (INDEPENDENT_AMBULATORY_CARE_PROVIDER_SITE_OTHER): Payer: Medicaid Other | Admitting: Advanced Practice Midwife

## 2019-08-12 VITALS — BP 98/62 | HR 99 | Wt 115.8 lb

## 2019-08-12 DIAGNOSIS — N898 Other specified noninflammatory disorders of vagina: Secondary | ICD-10-CM | POA: Insufficient documentation

## 2019-08-12 NOTE — Patient Instructions (Signed)

## 2019-08-12 NOTE — Progress Notes (Signed)
Pt in office complaining of white vaginal discharge, no other symptoms.

## 2019-08-12 NOTE — Progress Notes (Signed)
  GYNECOLOGY PROGRESS NOTE  History:  24 y.o. G1P1001 presents to [facility] office today for problem gyn visit. She reports white vaginal discharge without irritation, itching or odor.  She denies h/a, dizziness, shortness of breath, n/v, or fever/chills.    The following portions of the patient's history were reviewed and updated as appropriate: allergies, current medications, past family history, past medical history, past social history, past surgical history and problem list. Last pap smear on 05/07/2019 was normal.  Review of Systems:  Pertinent items are noted in HPI.   Objective:  Physical Exam Blood pressure 98/62, pulse 99, weight 115 lb 12.8 oz (52.5 kg), unknown if currently breastfeeding. VS reviewed, nursing note reviewed,  Constitutional: well developed, well nourished, no distress HEENT: normocephalic CV: normal rate Pulm/chest wall: normal effort Breast Exam: deferred Abdomen: soft Neuro: alert and oriented x 3 Skin: warm, dry Psych: affect normal Pelvic exam: Cervix pink, visually closed, without lesion, scant white creamy discharge, vaginal walls and external genitalia normal Bimanual exam: Cervix 0/long/high, firm, anterior, neg CMT, uterus nontender, nonenlarged, adnexa without tenderness, enlargement, or mass  Assessment & Plan:  1. Vaginal discharge --White vaginal discharge, no irritation, itching, or odor. Pt with hx of BV, is concerned that this is the start of it.   --BV prevention discussed, including probiotics, decreased washing with soap, wearing breathable cotton underwear, etc.  - Cervicovaginal ancillary only( [place])   Sharen Counter, CNM 10:42 AM

## 2019-08-13 LAB — CERVICOVAGINAL ANCILLARY ONLY
Bacterial Vaginitis (gardnerella): POSITIVE — AB
Candida Glabrata: NEGATIVE
Candida Vaginitis: POSITIVE — AB
Chlamydia: NEGATIVE
Comment: NEGATIVE
Comment: NEGATIVE
Comment: NEGATIVE
Comment: NEGATIVE
Comment: NEGATIVE
Comment: NORMAL
Neisseria Gonorrhea: NEGATIVE
Trichomonas: NEGATIVE

## 2019-08-14 ENCOUNTER — Other Ambulatory Visit: Payer: Self-pay | Admitting: Advanced Practice Midwife

## 2019-08-14 DIAGNOSIS — B9689 Other specified bacterial agents as the cause of diseases classified elsewhere: Secondary | ICD-10-CM

## 2019-08-14 DIAGNOSIS — B373 Candidiasis of vulva and vagina: Secondary | ICD-10-CM

## 2019-08-14 DIAGNOSIS — B3731 Acute candidiasis of vulva and vagina: Secondary | ICD-10-CM

## 2019-08-14 MED ORDER — METRONIDAZOLE 500 MG PO TABS
500.0000 mg | ORAL_TABLET | Freq: Two times a day (BID) | ORAL | 0 refills | Status: AC
Start: 2019-08-14 — End: 2019-08-21

## 2019-08-14 MED ORDER — FLUCONAZOLE 150 MG PO TABS: 150.0000 mg | ORAL_TABLET | Freq: Once | ORAL | 1 refills | Status: AC

## 2019-09-03 IMAGING — US US MFM OB FOLLOW-UP
1 series · 14 of 28 positions shown · non-contrast
Comparison: none

[Series 1: us mfm ob follow-up · 44 acquisitions, 14 frames shown]
[im 2/44]
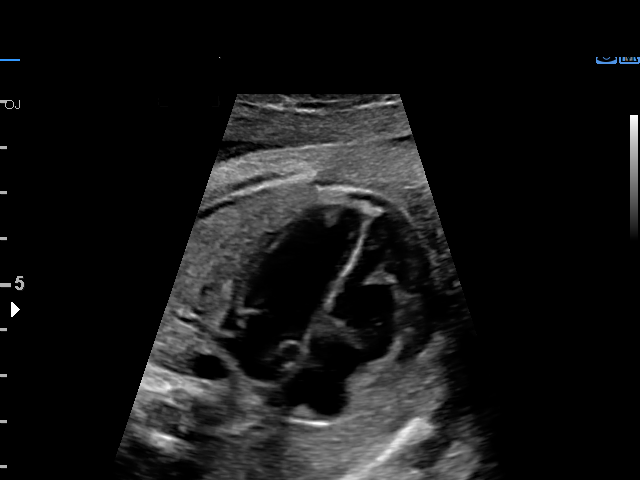
[im 5/44]
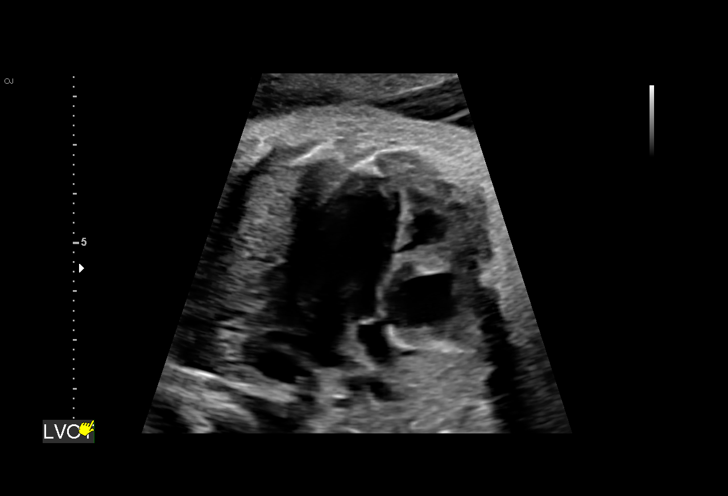
[im 8/44]
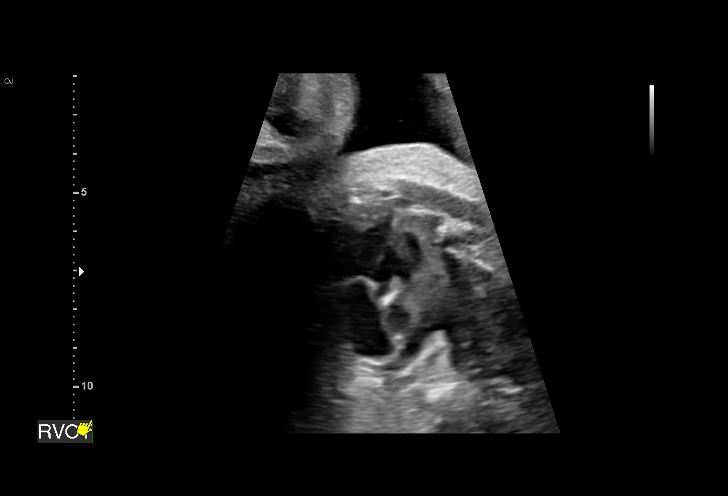
[im 12/44]
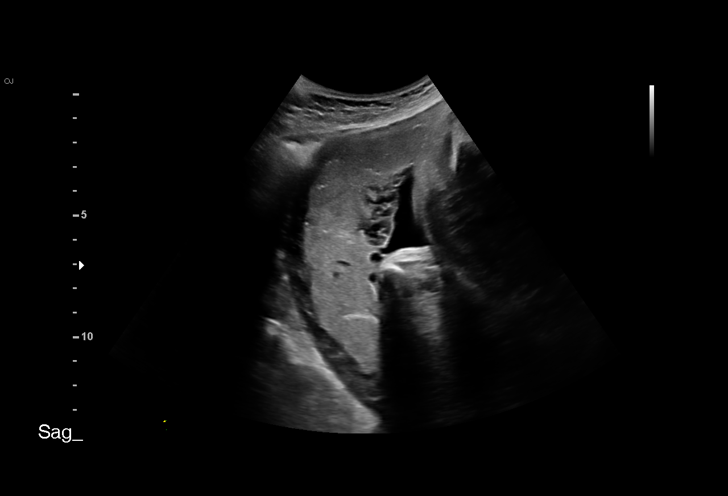
[im 15/44]
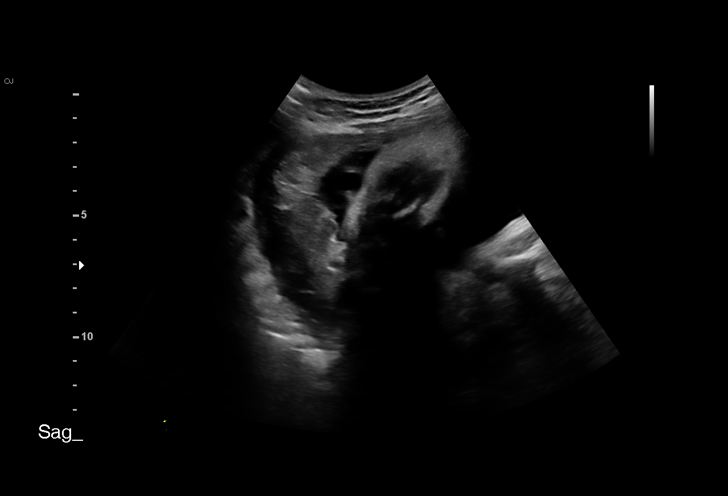
[im 18/44]
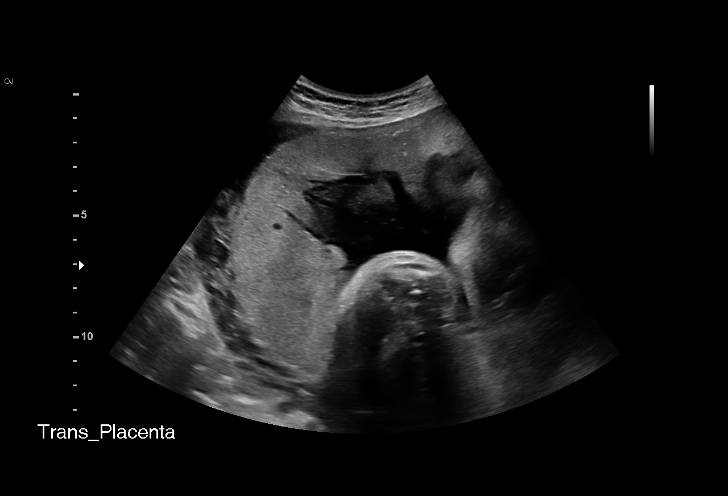
[im 21/44]
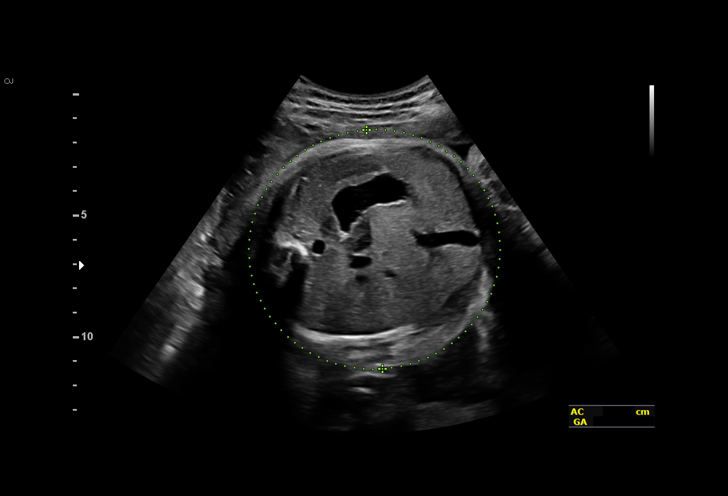
[im 24/44]
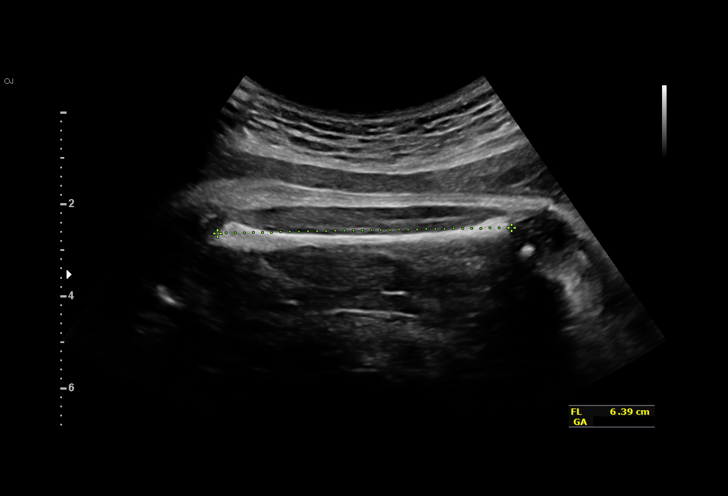
[im 28/44]
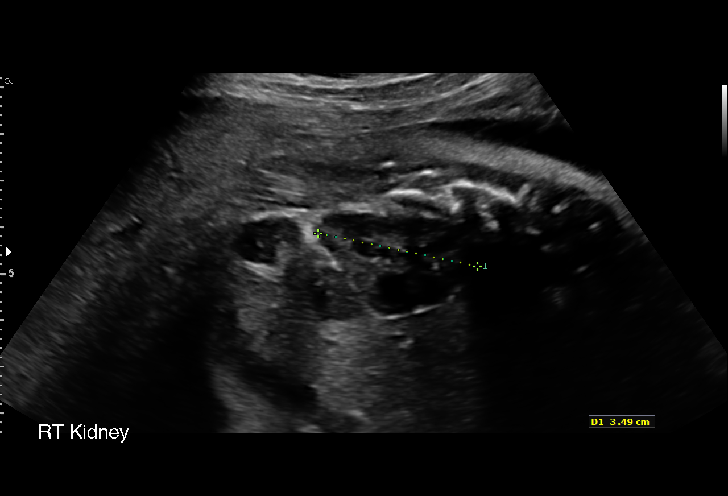
[im 31/44]
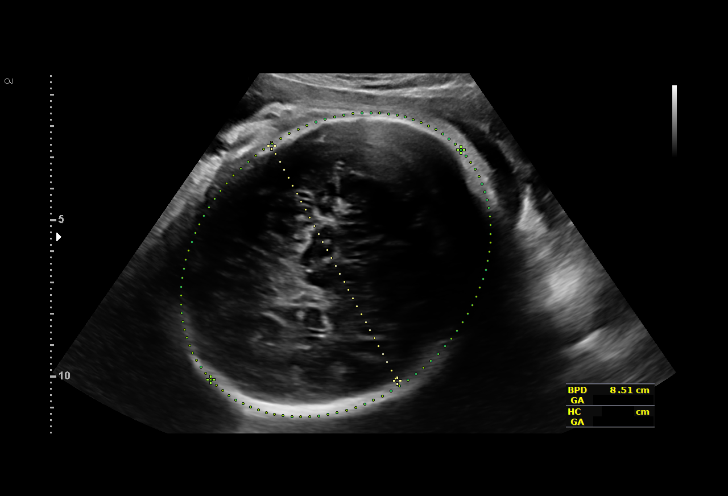
[im 34/44]
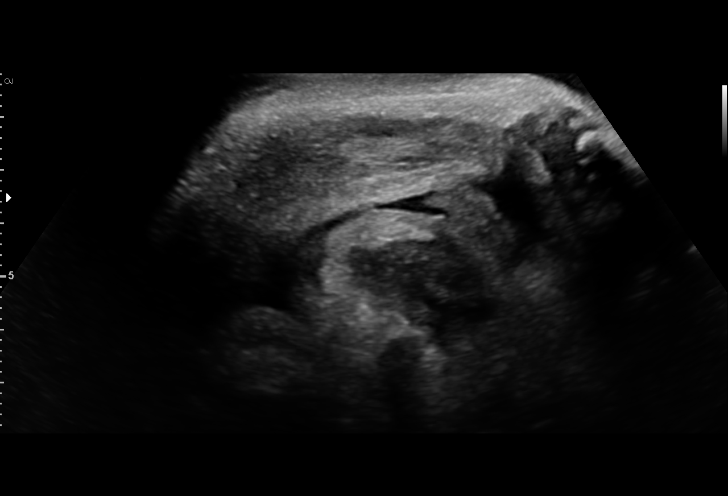
[im 37/44]
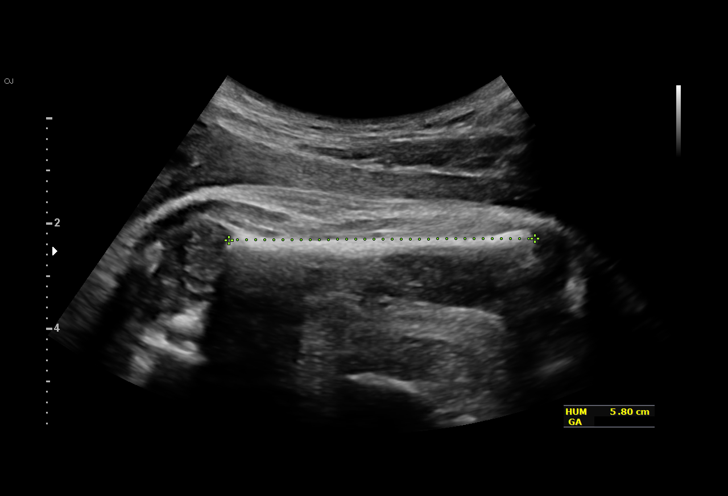
[im 40/44]
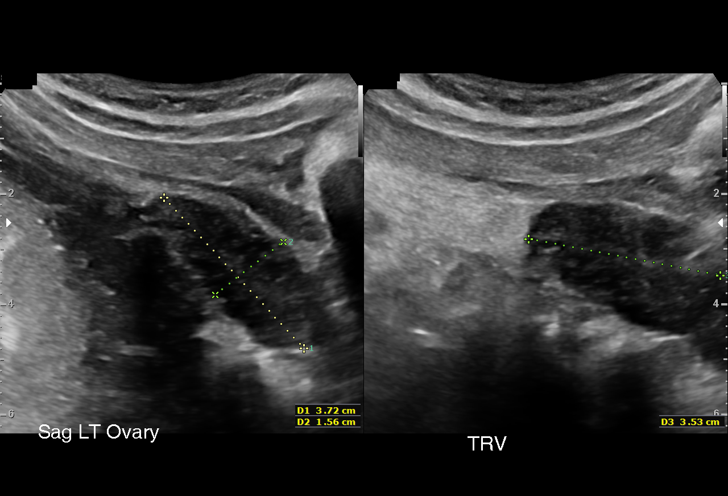
[im 44/44]
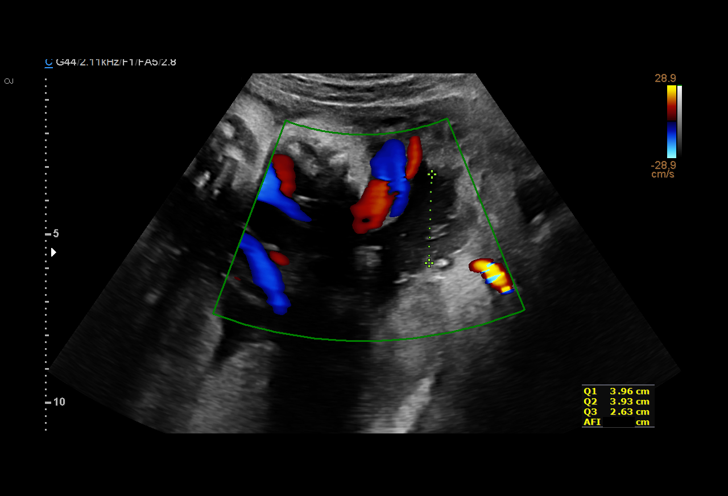

[14 of 28 positions shown; findings below may reference images not displayed]

[REDACTED]care -
[REDACTED]
[REDACTED] [HOSPITAL]

1  ELTORRENTE SIMUNAC             [id number]      [id number]     [id number]
Indications

35 weeks gestation of pregnancy
Poor weight gain of pregnancy, third trimester
OB History

Gravidity:    1         Term:   0        Prem:   0        SAB:   0
TOP:          0       Ectopic:  0        Living: 0
Fetal Evaluation

Num Of Fetuses:     1
Fetal Heart         134
Rate(bpm):
Cardiac Activity:   Observed
Presentation:       Cephalic
Placenta:           Fundal, above cervical os
P. Cord Insertion:  Previously Visualized

Amniotic Fluid
AFI FV:      Subjectively within normal limits

AFI Sum(cm)     %Tile       Largest Pocket(cm)
10.52           25
RUQ(cm)                     LUQ(cm)        LLQ(cm)
3.96
Biometry

BPD:      86.6  mm     G. Age:  35w 0d         37  %    CI:         76.2   %    70 - 86
FL/HC:      20.2   %    20.1 -
HC:      314.4  mm     G. Age:  35w 2d         15  %    HC/AC:      0.99        0.93 -
AC:      319.1  mm     G. Age:  35w 6d         66  %    FL/BPD:     73.4   %    71 - 87
FL:       63.6  mm     G. Age:  32w 6d        < 3  %    FL/AC:      19.9   %    20 - 24
HUM:      57.5  mm     G. Age:  33w 2d         24  %

Est. FW:    2292  gm    5 lb 10 oz      49  %
Gestational Age

LMP:           35w 4d        Date:  10/06/16                 EDD:   07/13/17
U/S Today:     34w 5d                                        EDD:   07/19/17
Best:          35w 4d     Det. By:  LMP  (10/06/16)          EDD:   07/13/17
Anatomy

Cranium:               Appears normal         Aortic Arch:            Not well visualized
Cavum:                 Previously seen        Ductal Arch:            Appears normal
Ventricles:            Previously seen        Diaphragm:              Previously seen
Choroid Plexus:        Previously seen        Stomach:                Appears normal, left
sided
Cerebellum:            Previously seen        Abdomen:                Appears normal
Posterior Fossa:       Appears normal         Abdominal Wall:         Previously seen
Nuchal Fold:           Previously seen        Cord Vessels:           Appears normal (3
vessel cord)
Face:                  Orbits and profile     Kidneys:                Appear normal
previously seen
Lips:                  Previously seen        Bladder:                Appears normal
Thoracic:              Appears normal         Spine:                  Previously seen
Heart:                 Appears normal         Upper Extremities:      Previously seen
(4CH, axis, and situs
RVOT:                  Appears normal         Lower Extremities:      Previously seen
LVOT:                  Appears normal

Other:  Parents do not wish to know sex of fetus at this time. Heels and 5th
digit prev. visualized.
Cervix Uterus Adnexa

Cervix
Not visualized (advanced GA >98wks)

Uterus
No abnormality visualized.

Left Ovary
Within normal limits.

Right Ovary
Within normal limits.

Cul De Sac:   No free fluid seen.

Adnexa:       No abnormality visualized.
Impression

Single living intrauterine pregnancy at 35w 4d.
Cephalic presentation.
Placenta Fundal, above cervical os.
Normal amniotic fluid volume.
Appropriate interval fetal growth.
Normal interval fetal anatomy.
Recommendations

Follow-up ultrasounds as clinically indicated.

## 2019-09-17 ENCOUNTER — Ambulatory Visit: Payer: Medicaid Other

## 2019-09-19 ENCOUNTER — Ambulatory Visit: Payer: Medicaid Other

## 2019-09-23 ENCOUNTER — Other Ambulatory Visit: Payer: Self-pay

## 2019-09-23 ENCOUNTER — Ambulatory Visit (INDEPENDENT_AMBULATORY_CARE_PROVIDER_SITE_OTHER): Payer: Medicaid Other

## 2019-09-23 VITALS — BP 94/60 | HR 93 | Ht 61.0 in | Wt 114.0 lb

## 2019-09-23 DIAGNOSIS — Z3042 Encounter for surveillance of injectable contraceptive: Secondary | ICD-10-CM

## 2019-09-23 MED ORDER — MEDROXYPROGESTERONE ACETATE 150 MG/ML IM SUSP
150.0000 mg | INTRAMUSCULAR | 3 refills | Status: DC
Start: 2019-09-23 — End: 2021-02-24

## 2019-09-23 MED ORDER — MEDROXYPROGESTERONE ACETATE 150 MG/ML IM SUSP
150.0000 mg | Freq: Once | INTRAMUSCULAR | Status: AC
  Administered 2019-09-23: 150 mg via INTRAMUSCULAR

## 2019-09-23 NOTE — Progress Notes (Signed)
Presents for DEPO Injection, given LUOQ, tolerated well.  Next DEPO July 19 - December 23, 2019  Administrations This Visit    medroxyPROGESTERone (DEPO-PROVERA) injection 150 mg    Admin Date 09/23/2019 Action Given Dose 150 mg Route Intramuscular Administered By Maretta Bees, RMA

## 2019-09-27 NOTE — Progress Notes (Signed)
I reviewed the nurses note and agree with the plan of care.   Sher Shampine A, MD 08/18/2017 10:46 AM  

## 2019-11-22 ENCOUNTER — Ambulatory Visit: Payer: Medicaid Other | Admitting: Women's Health

## 2019-12-09 ENCOUNTER — Other Ambulatory Visit: Payer: Self-pay | Admitting: Obstetrics

## 2019-12-12 ENCOUNTER — Ambulatory Visit: Payer: Medicaid Other

## 2019-12-16 ENCOUNTER — Other Ambulatory Visit: Payer: Self-pay

## 2019-12-16 ENCOUNTER — Encounter: Payer: Self-pay | Admitting: Nurse Practitioner

## 2019-12-16 ENCOUNTER — Ambulatory Visit (INDEPENDENT_AMBULATORY_CARE_PROVIDER_SITE_OTHER): Payer: Medicaid Other | Admitting: Nurse Practitioner

## 2019-12-16 ENCOUNTER — Other Ambulatory Visit (HOSPITAL_COMMUNITY)
Admission: RE | Admit: 2019-12-16 | Discharge: 2019-12-16 | Disposition: A | Discharge status: Home / Self Care | Payer: Medicaid Other | Source: Ambulatory Visit | Attending: Nurse Practitioner | Admitting: Nurse Practitioner

## 2019-12-16 VITALS — BP 89/60 | HR 83 | Ht 61.0 in | Wt 116.0 lb

## 2019-12-16 DIAGNOSIS — N760 Acute vaginitis: Secondary | ICD-10-CM | POA: Diagnosis not present

## 2019-12-16 DIAGNOSIS — Z3042 Encounter for surveillance of injectable contraceptive: Secondary | ICD-10-CM

## 2019-12-16 DIAGNOSIS — B9689 Other specified bacterial agents as the cause of diseases classified elsewhere: Secondary | ICD-10-CM

## 2019-12-16 DIAGNOSIS — N898 Other specified noninflammatory disorders of vagina: Secondary | ICD-10-CM | POA: Diagnosis not present

## 2019-12-16 DIAGNOSIS — B379 Candidiasis, unspecified: Secondary | ICD-10-CM

## 2019-12-16 MED ORDER — MEDROXYPROGESTERONE ACETATE 150 MG/ML IM SUSP
150.0000 mg | Freq: Once | INTRAMUSCULAR | Status: AC
  Administered 2019-12-16: 150 mg via INTRAMUSCULAR

## 2019-12-16 NOTE — Progress Notes (Signed)
GYN presents for white, vaginal discharge x 1 week.  Denies odor, pain, itching, fever or chills.  DEPO given today in RUOQ, tolerated well.  Next DEPO Oct. 11-25, 2021  Administrations This Visit    medroxyPROGESTERone (DEPO-PROVERA) injection 150 mg    Admin Date 12/16/2019 Action Given Dose 150 mg Route Intramuscular Administered By Maretta Bees, RMA

## 2019-12-16 NOTE — Progress Notes (Signed)
   GYNECOLOGY OFFICE VISIT NOTE   History:  24 y.o. G1P1001 here today for Depo shot and is having a problem with burning during sex for one week.  At the same time, she notices a white discharge.  Has been treated for yeast and BV in December 2020 and March 2021. She denies any abnormal vaginal bleeding, pelvic pain or other concerns.   Past Medical History:  Diagnosis Date  . Medical history non-contributory     Past Surgical History:  Procedure Laterality Date  . NO PAST SURGERIES      The following portions of the patient's history were reviewed and updated as appropriate: allergies, current medications, past family history, past medical history, past social history, past surgical history and problem list.   Health Maintenance:  Normal pap on 05-07-19.    Review of Systems:  Pertinent items noted in HPI and remainder of comprehensive ROS otherwise negative.  Objective:  Physical Exam BP (!) 89/60   Pulse 83   Ht 5\' 1"  (1.549 m)   Wt 116 lb (52.6 kg)   LMP  (LMP Unknown)   BMI 21.92 kg/m  CONSTITUTIONAL: Well-developed, well-nourished female in no acute distress.  HENT:  Normocephalic, atraumatic. External right and left ear normal.  EYES: Conjunctivae and EOM are normal. Pupils are equal, round.  No scleral icterus.  NECK: Normal range of motion, supple, no masses SKIN: Skin is warm and dry. No rash noted. Not diaphoretic. No erythema. No pallor. NEUROLOGIC: Alert and oriented to person, place, and time. Normal muscle tone coordination. No cranial nerve deficit noted. PSYCHIATRIC: Normal mood and affect. Normal behavior. Normal judgment and thought content. CARDIOVASCULAR: Normal heart rate noted RESPIRATORY: Effort and breath sounds normal, no problems with respiration noted ABDOMEN: Soft, no distention noted.   PELVIC: vaginal swab done.  No discharge seen except a small clump on the vaginal swab although entire mucus membranes at the introitus are red and seem dry    MUSCULOSKELETAL: Normal range of motion. No edema noted.  Labs and Imaging No results found.  Assessment & Plan:  1. Encounter for surveillance of injectable contraceptive Continuing Depo  2. Vaginal irritation Having problems with intercourse for one week and has notice white discharge Switch back to [facility] bath soap No douching - client does not Will await lab results and treat as indicated.  - Cervicovaginal ancillary only( [place])   Routine preventative health maintenance measures emphasized. Please refer to After Visit Summary for other counseling recommendations.   Return in about 3 months (around 03/17/2020) for RN visit for Depo.   Total face-to-face time with patient: 15 minutes.  Over 50% of encounter was spent on counseling and coordination of care.  03/19/2020, RN, MSN, NP-BC Nurse Practitioner, [facility] 12/16/2019 1:34 PM

## 2019-12-17 LAB — CERVICOVAGINAL ANCILLARY ONLY
Bacterial Vaginitis (gardnerella): POSITIVE — AB
Candida Glabrata: NEGATIVE
Candida Vaginitis: POSITIVE — AB
Chlamydia: NEGATIVE
Comment: NEGATIVE
Comment: NEGATIVE
Comment: NEGATIVE
Comment: NEGATIVE
Comment: NEGATIVE
Comment: NORMAL
Neisseria Gonorrhea: NEGATIVE
Trichomonas: NEGATIVE

## 2019-12-19 MED ORDER — METRONIDAZOLE 500 MG PO TABS: 500.0000 mg | ORAL_TABLET | Freq: Two times a day (BID) | ORAL | 0 refills | Status: AC

## 2019-12-19 MED ORDER — FLUCONAZOLE 150 MG PO TABS
ORAL_TABLET | ORAL | 0 refills | Status: DC
Start: 2019-12-19 — End: 2023-09-19

## 2019-12-19 MED ORDER — TERCONAZOLE 0.4 % VA CREA
1.0000 | TOPICAL_CREAM | Freq: Every day | VAGINAL | 1 refills | Status: AC
Start: 2019-12-19 — End: 2019-12-26

## 2019-12-19 NOTE — Addendum Note (Signed)
Addended by: Currie Paris on: 12/19/2019 09:32 AM   Modules accepted: Orders

## 2020-02-05 ENCOUNTER — Telehealth: Payer: Self-pay

## 2020-02-05 NOTE — Telephone Encounter (Signed)
Returned call, no answer, left vm 

## 2020-03-09 ENCOUNTER — Ambulatory Visit: Payer: Medicaid Other

## 2020-03-13 ENCOUNTER — Other Ambulatory Visit: Payer: Self-pay

## 2020-03-13 ENCOUNTER — Ambulatory Visit (INDEPENDENT_AMBULATORY_CARE_PROVIDER_SITE_OTHER): Payer: Medicaid Other

## 2020-03-13 DIAGNOSIS — Z3042 Encounter for surveillance of injectable contraceptive: Secondary | ICD-10-CM | POA: Diagnosis not present

## 2020-03-13 MED ORDER — MEDROXYPROGESTERONE ACETATE 150 MG/ML IM SUSP
150.0000 mg | Freq: Once | INTRAMUSCULAR | Status: AC
  Administered 2020-03-13: 150 mg via INTRAMUSCULAR

## 2020-03-13 NOTE — Progress Notes (Addendum)
Nurse visit for pt supply Depo Pt is on time for inj Inj. given RUOQ without complaints Next Depo due Jan 7-21, pt agrees  Administrations This Visit    medroxyPROGESTERone (DEPO-PROVERA) injection 150 mg    Admin Date 03/13/2020 Action Given Dose 150 mg Route Intramuscular Administered By Lewayne Bunting, CMA

## 2020-03-13 NOTE — Progress Notes (Signed)
Agree with A & P. 

## 2020-06-04 ENCOUNTER — Ambulatory Visit: Payer: Medicaid Other

## 2020-06-10 ENCOUNTER — Ambulatory Visit: Payer: Medicaid Other

## 2020-07-20 ENCOUNTER — Other Ambulatory Visit (HOSPITAL_COMMUNITY)
Admission: RE | Admit: 2020-07-20 | Discharge: 2020-07-20 | Disposition: A | Discharge status: Home / Self Care | Payer: Medicaid Other | Source: Ambulatory Visit | Attending: Obstetrics | Admitting: Obstetrics

## 2020-07-20 ENCOUNTER — Other Ambulatory Visit: Payer: Self-pay

## 2020-07-20 ENCOUNTER — Ambulatory Visit (INDEPENDENT_AMBULATORY_CARE_PROVIDER_SITE_OTHER): Payer: Medicaid Other | Admitting: *Deleted

## 2020-07-20 DIAGNOSIS — Z113 Encounter for screening for infections with a predominantly sexual mode of transmission: Secondary | ICD-10-CM

## 2020-07-20 DIAGNOSIS — N760 Acute vaginitis: Secondary | ICD-10-CM

## 2020-07-20 NOTE — Progress Notes (Signed)
SUBJECTIVE:  25 y.o. female complains of white and thick vaginal discharge for the past few day(s). Denies abnormal vaginal bleeding or significant pelvic pain or fever. No UTI symptoms. Denies history of known exposure to STD. Pt request STD screen as well today.   No LMP recorded. Patient has had an injection.  OBJECTIVE:  She appears well, afebrile. Urine dipstick: not done.  ASSESSMENT:  Vaginal Discharge  Vaginal Irritation   PLAN:  GC, chlamydia, trichomonas, BVAG, CVAG probe sent to lab. Treatment: To be determined once lab results are received ROV prn if symptoms persist or worsen.

## 2020-07-20 NOTE — Progress Notes (Signed)
Patient was assessed and managed by nursing staff during this encounter. I have reviewed the chart and agree with the documentation and plan. I have also made any necessary editorial changes.  Catalina Antigua, MD 07/20/2020 11:38 AM

## 2020-07-21 ENCOUNTER — Other Ambulatory Visit: Payer: Self-pay | Admitting: Obstetrics and Gynecology

## 2020-07-21 LAB — CERVICOVAGINAL ANCILLARY ONLY
Bacterial Vaginitis (gardnerella): POSITIVE — AB
Candida Glabrata: NEGATIVE
Candida Vaginitis: POSITIVE — AB
Chlamydia: NEGATIVE
Comment: NEGATIVE
Comment: NEGATIVE
Comment: NEGATIVE
Comment: NEGATIVE
Comment: NEGATIVE
Comment: NORMAL
Neisseria Gonorrhea: NEGATIVE
Trichomonas: NEGATIVE

## 2020-07-21 MED ORDER — FLUCONAZOLE 150 MG PO TABS: 150.0000 mg | ORAL_TABLET | Freq: Once | ORAL | 3 refills | Status: AC

## 2020-07-21 MED ORDER — METRONIDAZOLE 500 MG PO TABS: 500.0000 mg | ORAL_TABLET | Freq: Two times a day (BID) | ORAL | 0 refills | Status: DC

## 2021-01-01 ENCOUNTER — Ambulatory Visit: Payer: Medicaid Other | Admitting: Obstetrics

## 2021-02-08 ENCOUNTER — Ambulatory Visit: Payer: Medicaid Other | Admitting: Obstetrics

## 2021-02-24 ENCOUNTER — Ambulatory Visit (INDEPENDENT_AMBULATORY_CARE_PROVIDER_SITE_OTHER): Payer: Medicaid Other | Admitting: Obstetrics

## 2021-02-24 ENCOUNTER — Encounter: Payer: Self-pay | Admitting: Obstetrics

## 2021-02-24 ENCOUNTER — Other Ambulatory Visit: Payer: Self-pay

## 2021-02-24 ENCOUNTER — Other Ambulatory Visit (HOSPITAL_COMMUNITY)
Admission: RE | Admit: 2021-02-24 | Discharge: 2021-02-24 | Disposition: A | Discharge status: Home / Self Care | Payer: Medicaid Other | Source: Ambulatory Visit | Attending: Obstetrics | Admitting: Obstetrics

## 2021-02-24 VITALS — BP 92/59 | HR 92 | Ht 61.0 in | Wt 113.0 lb

## 2021-02-24 DIAGNOSIS — E569 Vitamin deficiency, unspecified: Secondary | ICD-10-CM

## 2021-02-24 DIAGNOSIS — Z3009 Encounter for other general counseling and advice on contraception: Secondary | ICD-10-CM

## 2021-02-24 DIAGNOSIS — Z01419 Encounter for gynecological examination (general) (routine) without abnormal findings: Secondary | ICD-10-CM | POA: Diagnosis not present

## 2021-02-24 DIAGNOSIS — N898 Other specified noninflammatory disorders of vagina: Secondary | ICD-10-CM | POA: Insufficient documentation

## 2021-02-24 DIAGNOSIS — Z30011 Encounter for initial prescription of contraceptive pills: Secondary | ICD-10-CM

## 2021-02-24 MED ORDER — LO LOESTRIN FE 1 MG-10 MCG / 10 MCG PO TABS: 1.0000 | ORAL_TABLET | Freq: Every day | ORAL | 4 refills | Status: DC

## 2021-02-24 MED ORDER — PRENATE PIXIE 10-0.6-0.4-200 MG PO CAPS: 1.0000 | ORAL_CAPSULE | Freq: Every day | ORAL | 11 refills | Status: DC

## 2021-02-24 NOTE — Progress Notes (Signed)
GYN presents for AEX/PAP/STD.  She wants to start OCP.  C/o AUB, bleeding for months, goes away for two days and return, nausea.  Denies pain, discharge, odor.

## 2021-02-24 NOTE — Progress Notes (Signed)
Subjective:        Teresa Johnson is a 25 y.o. female here for a routine exam.  Current complaints: Vaginal discharge.    Personal health questionnaire:  Is patient Ashkenazi Jewish, have a family history of breast and/or ovarian cancer: no Is there a family history of uterine cancer diagnosed at age < 2, gastrointestinal cancer, urinary tract cancer, family member who is a Personnel officer syndrome-associated carrier: no Is the patient overweight and hypertensive, family history of diabetes, personal history of gestational diabetes, preeclampsia or PCOS: no Is patient over 79, have PCOS,  family history of premature CHD under age 40, diabetes, smoke, have hypertension or peripheral artery disease:  no At any time, has a partner hit, kicked or otherwise hurt or frightened you?: no Over the past 2 weeks, have you felt down, depressed or hopeless?: no Over the past 2 weeks, have you felt little interest or pleasure in doing things?:no   Gynecologic History No LMP recorded. Patient has had an injection. Contraception: condoms Last Pap: 05-07-2019. Results were: normal Last mammogram: n/a. Results were: n/a  Obstetric History OB History  Gravida Para Term Preterm AB Living  1 1 1     1   SAB IAB Ectopic Multiple Live Births        0 1    # Outcome Date GA Lbr Len/2nd Weight Sex Delivery Anes PTL Lv  1 Term 07/03/17 [redacted]w[redacted]d 07:51 / 00:22 6 lb 1.5 oz (2.765 kg) F Vag-Spont EPI  LIV    Past Medical History:  Diagnosis Date   Medical history non-contributory     Past Surgical History:  Procedure Laterality Date   NO PAST SURGERIES       Current Outpatient Medications:    azelastine (ASTELIN) 0.1 % nasal spray, USE 2 SPRAYS IN EACH NOSTRIL 4 TIMES A DAY FOR 3-5 DAYS, THEN TWICE DAILY THEREAFTER (Patient not taking: Reported on 03/13/2020), Disp: , Rfl:    calcium carbonate (TUMS - DOSED IN MG ELEMENTAL CALCIUM) 500 MG chewable tablet, Chew 1 tablet by mouth daily. (Patient not taking:  Reported on 03/13/2020), Disp: , Rfl:    cetirizine (ZYRTEC) 10 MG tablet, Take 10 mg by mouth daily. (Patient not taking: Reported on 03/13/2020), Disp: , Rfl:    fluconazole (DIFLUCAN) 150 MG tablet, Take one tablet by mouth now and one after you finish the 7 days of antibiotics. (Patient not taking: Reported on 03/13/2020), Disp: 2 tablet, Rfl: 0   medroxyPROGESTERone (DEPO-PROVERA) 150 MG/ML injection, Inject 1 mL (150 mg total) into the muscle every 3 (three) months. (Patient not taking: Reported on 02/24/2021), Disp: 1 mL, Rfl: 3   metroNIDAZOLE (FLAGYL) 500 MG tablet, Take 1 tablet (500 mg total) by mouth 2 (two) times daily. (Patient not taking: Reported on 02/24/2021), Disp: 14 tablet, Rfl: 0   Prenat-FeAsp-Meth-FA-DHA w/o A (PRENATE PIXIE) 10-0.6-0.4-200 MG CAPS, Take 1 tablet by mouth daily. (Patient not taking: Reported on 02/24/2021), Disp: , Rfl:  No Known Allergies  Social History   Tobacco Use   Smoking status: Never   Smokeless tobacco: Never  Substance Use Topics   Alcohol use: No    Family History  Problem Relation Age of Onset   Diabetes Mother       Review of Systems  Constitutional: negative for fatigue and weight loss Respiratory: negative for cough and wheezing Cardiovascular: negative for chest pain, fatigue and palpitations Gastrointestinal: negative for abdominal pain and change in bowel habits Musculoskeletal:negative for myalgias Neurological: negative for  gait problems and tremors Behavioral/Psych: negative for abusive relationship, depression Endocrine: negative for temperature intolerance    Genitourinary: positive for vaginal discharge.  negative for abnormal menstrual periods, genital lesions, hot flashes, sexual problems  Integument/breast: negative for breast lump, breast tenderness, nipple discharge and skin lesion(s)    Objective:       There were no vitals taken for this visit. General:   Alert and no distress  Skin:   no rash or  abnormalities  Lungs:   clear to auscultation bilaterally  Heart:   regular rate and rhythm, S1, S2 normal, no murmur, click, rub or gallop  Breasts:   normal without suspicious masses, skin or nipple changes or axillary nodes  Abdomen:  normal findings: no organomegaly, soft, non-tender and no hernia  Pelvis:  External genitalia: normal general appearance Urinary system: urethral meatus normal and bladder without fullness, nontender Vaginal: normal without tenderness, induration or masses Cervix: normal appearance Adnexa: normal bimanual exam Uterus: anteverted and non-tender, normal size   Lab Review Urine pregnancy test Labs reviewed yes Radiologic studies reviewed no  Assessment:     1. Encounter for routine gynecological examination with Papanicolaou smear of cervix Rx: - Cytology - PAP( [place])  2. Vaginal discharge Rx: - Cervicovaginal ancillary only( [place])  3. Encounter for other general counseling and advice on contraception - wants OCP's  4. Encounter for initial prescription of contraceptive pills Rx: - LO LOESTRIN FE 1 MG-10 MCG / 10 MCG tablet; Take 1 tablet by mouth daily. Start taking pills on the 1st day of period.  Dispense: 84 tablet; Refill: 4 - condoms recommended for STD prevention  5. Vitamin deficiency Rx: - Prenat-FeAsp-Meth-FA-DHA w/o A (PRENATE PIXIE) 10-0.6-0.4-200 MG CAPS; Take 1 tablet by mouth daily before breakfast.  Dispense: 30 capsule; Refill: 11    Plan:    Education reviewed: calcium supplements, depression evaluation, low fat, low cholesterol diet, safe sex/STD prevention, self breast exams, and weight bearing exercise. Contraception: OCP (estrogen/progesterone). Follow up in: 4 months.     Brock Bad, MD 02/24/2021 4:04 PM

## 2021-02-25 LAB — CYTOLOGY - PAP: Diagnosis: NEGATIVE

## 2021-02-25 LAB — CERVICOVAGINAL ANCILLARY ONLY
Bacterial Vaginitis (gardnerella): POSITIVE — AB
Candida Glabrata: NEGATIVE
Candida Vaginitis: NEGATIVE
Chlamydia: NEGATIVE
Comment: NEGATIVE
Comment: NEGATIVE
Comment: NEGATIVE
Comment: NEGATIVE
Comment: NEGATIVE
Comment: NORMAL
Neisseria Gonorrhea: NEGATIVE
Trichomonas: NEGATIVE

## 2021-03-02 ENCOUNTER — Other Ambulatory Visit: Payer: Self-pay | Admitting: Obstetrics

## 2021-03-02 DIAGNOSIS — N760 Acute vaginitis: Secondary | ICD-10-CM

## 2021-03-02 DIAGNOSIS — B9689 Other specified bacterial agents as the cause of diseases classified elsewhere: Secondary | ICD-10-CM

## 2021-03-02 MED ORDER — METRONIDAZOLE 500 MG PO TABS: 500.0000 mg | ORAL_TABLET | Freq: Two times a day (BID) | ORAL | 0 refills | Status: DC

## 2021-03-22 ENCOUNTER — Other Ambulatory Visit: Payer: Self-pay

## 2021-03-22 DIAGNOSIS — Z30011 Encounter for initial prescription of contraceptive pills: Secondary | ICD-10-CM

## 2021-03-22 MED ORDER — LO LOESTRIN FE 1 MG-10 MCG / 10 MCG PO TABS: 1.0000 | ORAL_TABLET | Freq: Every day | ORAL | 4 refills | Status: DC

## 2021-06-16 ENCOUNTER — Ambulatory Visit: Payer: Medicaid Other

## 2021-07-05 ENCOUNTER — Ambulatory Visit: Payer: Medicaid Other

## 2021-07-07 ENCOUNTER — Ambulatory Visit: Payer: Medicaid Other

## 2021-09-07 ENCOUNTER — Ambulatory Visit: Payer: Medicaid Other

## 2021-09-23 ENCOUNTER — Other Ambulatory Visit (HOSPITAL_COMMUNITY)
Admission: RE | Admit: 2021-09-23 | Discharge: 2021-09-23 | Disposition: A | Discharge status: Home / Self Care | Payer: Medicaid Other | Source: Ambulatory Visit | Attending: Obstetrics and Gynecology | Admitting: Obstetrics and Gynecology

## 2021-09-23 ENCOUNTER — Ambulatory Visit (INDEPENDENT_AMBULATORY_CARE_PROVIDER_SITE_OTHER): Payer: Medicaid Other | Admitting: Emergency Medicine

## 2021-09-23 VITALS — BP 94/57 | HR 88 | Ht 61.0 in | Wt 118.4 lb

## 2021-09-23 DIAGNOSIS — N898 Other specified noninflammatory disorders of vagina: Secondary | ICD-10-CM

## 2021-09-23 NOTE — Progress Notes (Signed)
SUBJECTIVE:  ?26 y.o. female complains of vaginal odor for a few weeks.  ? ?Denies abnormal discharge, vaginal itching, irritation, abnormal vaginal bleeding or significant pelvic pain or ?fever. No UTI symptoms. Denies history of known exposure to STD. ? ?LMP 09/12/21 ? ?OBJECTIVE:  ?She appears well, afebrile. ?Urine dipstick: not done. ? ?ASSESSMENT:  ?Vaginal Discharge  ?Vaginal Odor- reports odor for a few weeks ? ? ?PLAN:  ?GC, chlamydia, trichomonas, BVAG, CVAG probe sent to lab. ?Treatment: To be determined once lab results are received ?ROV prn if symptoms persist or worsen.  ?

## 2021-09-23 NOTE — Progress Notes (Signed)
Patient was assessed and managed by nursing staff during this encounter. I have reviewed the chart and agree with the documentation and plan. I have also made any necessary editorial changes. ? ?Arles Rumbold A Valli Randol, MD ?09/23/2021 3:21 PM   ?

## 2021-09-27 LAB — CERVICOVAGINAL ANCILLARY ONLY
Bacterial Vaginitis (gardnerella): POSITIVE — AB
Candida Glabrata: NEGATIVE
Candida Vaginitis: NEGATIVE
Chlamydia: NEGATIVE
Comment: NEGATIVE
Comment: NEGATIVE
Comment: NEGATIVE
Comment: NEGATIVE
Comment: NEGATIVE
Comment: NORMAL
Neisseria Gonorrhea: NEGATIVE
Trichomonas: POSITIVE — AB

## 2021-09-28 ENCOUNTER — Telehealth: Payer: Self-pay

## 2021-09-28 MED ORDER — METRONIDAZOLE 500 MG PO TABS: 500.0000 mg | ORAL_TABLET | Freq: Two times a day (BID) | ORAL | 0 refills | Status: DC

## 2021-09-28 NOTE — Telephone Encounter (Signed)
S/w patient and advised of results, treatment plan, and rx sent per protocol ?

## 2022-01-20 ENCOUNTER — Ambulatory Visit: Payer: Self-pay

## 2022-01-27 ENCOUNTER — Ambulatory Visit: Payer: Self-pay

## 2022-01-27 NOTE — Progress Notes (Deleted)
SUBJECTIVE:  26 y.o. female complains of {pe vag discharge desc:315065} vaginal discharge for *** {gen duration:315003}. Denies abnormal vaginal bleeding or significant pelvic pain or fever. No UTI symptoms. Denies history of known exposure to STD.  No LMP recorded.  OBJECTIVE:  She appears well, afebrile. Urine dipstick: {ua dip:315113}.  ASSESSMENT:  Vaginal Discharge  Vaginal Odor   PLAN:  GC, chlamydia, trichomonas, BVAG, CVAG probe sent to lab. Treatment: To be determined once lab results are received ROV prn if symptoms persist or worsen.

## 2022-02-01 ENCOUNTER — Ambulatory Visit: Payer: Self-pay

## 2022-03-16 ENCOUNTER — Ambulatory Visit: Payer: Self-pay

## 2022-03-17 ENCOUNTER — Other Ambulatory Visit (HOSPITAL_COMMUNITY)
Admission: RE | Admit: 2022-03-17 | Discharge: 2022-03-17 | Disposition: A | Discharge status: Home / Self Care | Payer: Medicaid Other | Source: Ambulatory Visit | Attending: Obstetrics | Admitting: Obstetrics

## 2022-03-17 ENCOUNTER — Ambulatory Visit (INDEPENDENT_AMBULATORY_CARE_PROVIDER_SITE_OTHER): Payer: Medicaid Other | Admitting: Obstetrics

## 2022-03-17 ENCOUNTER — Encounter: Payer: Self-pay | Admitting: Obstetrics

## 2022-03-17 VITALS — BP 106/66 | HR 92 | Wt 121.8 lb

## 2022-03-17 DIAGNOSIS — N898 Other specified noninflammatory disorders of vagina: Secondary | ICD-10-CM

## 2022-03-17 DIAGNOSIS — Z3009 Encounter for other general counseling and advice on contraception: Secondary | ICD-10-CM | POA: Diagnosis not present

## 2022-03-17 DIAGNOSIS — Z01419 Encounter for gynecological examination (general) (routine) without abnormal findings: Secondary | ICD-10-CM

## 2022-03-17 DIAGNOSIS — Z113 Encounter for screening for infections with a predominantly sexual mode of transmission: Secondary | ICD-10-CM | POA: Diagnosis not present

## 2022-03-17 LAB — POCT URINE PREGNANCY: Preg Test, Ur: NEGATIVE

## 2022-03-17 NOTE — Progress Notes (Signed)
Pt presents for annual and all STD testing.  

## 2022-03-17 NOTE — Progress Notes (Signed)
Subjective:        Teresa Johnson is a 26 y.o. female here for a routine exam.  Current complaints: Vaginal discharge.    Personal health questionnaire:  Is patient Ashkenazi Jewish, have a family history of breast and/or ovarian cancer: no Is there a family history of uterine cancer diagnosed at age < 78, gastrointestinal cancer, urinary tract cancer, family member who is a Field seismologist syndrome-associated carrier: no Is the patient overweight and hypertensive, family history of diabetes, personal history of gestational diabetes, preeclampsia or PCOS: no Is patient over 78, have PCOS,  family history of premature CHD under age 29, diabetes, smoke, have hypertension or peripheral artery disease:  no At any time, has a partner hit, kicked or otherwise hurt or frightened you?: no Over the past 2 weeks, have you felt down, depressed or hopeless?: no Over the past 2 weeks, have you felt little interest or pleasure in doing things?:no   Gynecologic History Patient's last menstrual period was 02/10/2022. Contraception: none Last Pap: 2022. Results were: normal Last mammogram: n/a. Results were: n/a  Obstetric History OB History  Gravida Para Term Preterm AB Living  1 1 1     1   SAB IAB Ectopic Multiple Live Births        0 1    # Outcome Date GA Lbr Len/2nd Weight Sex Delivery Anes PTL Lv  1 Term 07/03/17 [redacted]w[redacted]d 07:51 / 00:22 6 lb 1.5 oz (2.765 kg) F Vag-Spont EPI  LIV    Past Medical History:  Diagnosis Date   Medical history non-contributory     Past Surgical History:  Procedure Laterality Date   NO PAST SURGERIES       Current Outpatient Medications:    azelastine (ASTELIN) 0.1 % nasal spray, USE 2 SPRAYS IN EACH NOSTRIL 4 TIMES A DAY FOR 3-5 DAYS, THEN TWICE DAILY THEREAFTER (Patient not taking: No sig reported), Disp: , Rfl:    calcium carbonate (TUMS - DOSED IN MG ELEMENTAL CALCIUM) 500 MG chewable tablet, Chew 1 tablet by mouth daily. (Patient not taking: Reported on  03/13/2020), Disp: , Rfl:    cetirizine (ZYRTEC) 10 MG tablet, Take 10 mg by mouth daily. (Patient not taking: Reported on 03/13/2020), Disp: , Rfl:    fluconazole (DIFLUCAN) 150 MG tablet, Take one tablet by mouth now and one after you finish the 7 days of antibiotics. (Patient not taking: Reported on 03/13/2020), Disp: 2 tablet, Rfl: 0   LO LOESTRIN FE 1 MG-10 MCG / 10 MCG tablet, Take 1 tablet by mouth daily. Start taking pills on the 1st day of period. (Patient not taking: Reported on 09/23/2021), Disp: 84 tablet, Rfl: 4   metroNIDAZOLE (FLAGYL) 500 MG tablet, Take 1 tablet (500 mg total) by mouth 2 (two) times daily. (Patient not taking: Reported on 09/23/2021), Disp: 14 tablet, Rfl: 0   metroNIDAZOLE (FLAGYL) 500 MG tablet, Take 1 tablet (500 mg total) by mouth 2 (two) times daily. (Patient not taking: Reported on 03/17/2022), Disp: 14 tablet, Rfl: 0   Prenat-FeAsp-Meth-FA-DHA w/o A (PRENATE PIXIE) 10-0.6-0.4-200 MG CAPS, Take 1 tablet by mouth daily before breakfast. (Patient not taking: Reported on 09/23/2021), Disp: 30 capsule, Rfl: 11 No Known Allergies  Social History   Tobacco Use   Smoking status: Never   Smokeless tobacco: Never  Substance Use Topics   Alcohol use: No    Family History  Problem Relation Age of Onset   Diabetes Mother       Review of Systems  Constitutional: negative for fatigue and weight loss Respiratory: negative for cough and wheezing Cardiovascular: negative for chest pain, fatigue and palpitations Gastrointestinal: negative for abdominal pain and change in bowel habits Musculoskeletal:negative for myalgias Neurological: negative for gait problems and tremors Behavioral/Psych: negative for abusive relationship, depression Endocrine: negative for temperature intolerance    Genitourinary:positive for vaginal discharge.  negative for abnormal menstrual periods, genital lesions, hot flashes, sexual problems  Integument/breast: negative for breast lump, breast  tenderness, nipple discharge and skin lesion(s)    Objective:       BP 106/66   Pulse 92   Wt 121 lb 12.8 oz (55.2 kg)   LMP 02/10/2022   BMI 23.01 kg/m  General:   Alert and no distress  Skin:   no rash or abnormalities  Lungs:   clear to auscultation bilaterally  Heart:   regular rate and rhythm, S1, S2 normal, no murmur, click, rub or gallop  Breasts:   normal without suspicious masses, skin or nipple changes or axillary nodes  Abdomen:  normal findings: no organomegaly, soft, non-tender and no hernia  Pelvis:  External genitalia: normal general appearance Urinary system: urethral meatus normal and bladder without fullness, nontender Vaginal: normal without tenderness, induration or masses Cervix: normal appearance Adnexa: normal bimanual exam Uterus: anteverted and non-tender, normal size   Lab Review Urine pregnancy test Labs reviewed yes Radiologic studies reviewed no  I have spent a total of 20 minutes of face-to-face time, excluding clinical staff time, reviewing notes and preparing to see patient, ordering tests and/or medications, and counseling the patient.   Assessment:    1. Encounter for gynecological examination with Papanicolaou smear of cervix Rx: - Cytology - PAP( [place]) - POCT urine pregnancy  2. Vaginal discharge Rx: - Cervicovaginal ancillary only( [place])  3. Screening examination for STD (sexually transmitted disease) Rx: - Hepatitis B surface antigen - Hepatitis C antibody - RPR - HIV Antibody (routine testing w rflx)  4. Encounter for other general counseling and advice on contraception - declines contraception - condoms recommended for STD prevention   Plan:    Education reviewed: none. Contraception: none. Follow up in: 1 year.    Orders Placed This Encounter  Procedures   Hepatitis B surface antigen   Hepatitis C antibody   RPR   HIV Antibody (routine testing w rflx)   POCT urine pregnancy     Brock Bad, MD 03/17/2022 4:38 PM

## 2022-03-18 LAB — HIV ANTIBODY (ROUTINE TESTING W REFLEX): HIV Screen 4th Generation wRfx: NONREACTIVE

## 2022-03-18 LAB — HEPATITIS C ANTIBODY: Hep C Virus Ab: NONREACTIVE

## 2022-03-18 LAB — HEPATITIS B SURFACE ANTIGEN: Hepatitis B Surface Ag: NEGATIVE

## 2022-03-18 LAB — RPR: RPR Ser Ql: NONREACTIVE

## 2022-03-21 LAB — CERVICOVAGINAL ANCILLARY ONLY
Bacterial Vaginitis (gardnerella): POSITIVE — AB
Candida Glabrata: NEGATIVE
Candida Vaginitis: NEGATIVE
Chlamydia: NEGATIVE
Comment: NEGATIVE
Comment: NEGATIVE
Comment: NEGATIVE
Comment: NEGATIVE
Comment: NEGATIVE
Comment: NORMAL
Neisseria Gonorrhea: NEGATIVE
Trichomonas: NEGATIVE

## 2022-03-22 ENCOUNTER — Other Ambulatory Visit: Payer: Self-pay | Admitting: Obstetrics

## 2022-03-22 DIAGNOSIS — N760 Acute vaginitis: Secondary | ICD-10-CM

## 2022-03-22 MED ORDER — METRONIDAZOLE 500 MG PO TABS: 500.0000 mg | ORAL_TABLET | Freq: Two times a day (BID) | ORAL | 0 refills | Status: DC

## 2022-03-22 NOTE — Progress Notes (Signed)
TC to notify pt of BV. Pt has seen results in [place]. Advised of RX Flagyl and confirmed pharmacy location with pt. Pt verbalized understanding.

## 2022-03-24 LAB — CYTOLOGY - PAP: Diagnosis: NEGATIVE

## 2022-04-27 ENCOUNTER — Ambulatory Visit: Payer: Medicaid Other | Admitting: Student

## 2022-05-06 ENCOUNTER — Other Ambulatory Visit: Payer: Self-pay | Admitting: *Deleted

## 2022-05-06 ENCOUNTER — Other Ambulatory Visit: Payer: Self-pay | Admitting: Obstetrics and Gynecology

## 2022-05-06 DIAGNOSIS — N898 Other specified noninflammatory disorders of vagina: Secondary | ICD-10-CM

## 2022-05-06 MED ORDER — FLUCONAZOLE 150 MG PO TABS: 150.0000 mg | ORAL_TABLET | Freq: Once | ORAL | 0 refills | Status: DC

## 2022-05-06 NOTE — Progress Notes (Signed)
Pt reports white vaginal discharge and vaginal itching. RX Diflucan per protocol.

## 2022-06-29 ENCOUNTER — Ambulatory Visit: Payer: Medicaid Other

## 2022-07-04 ENCOUNTER — Ambulatory Visit: Payer: Medicaid Other

## 2022-07-19 ENCOUNTER — Ambulatory Visit: Payer: Medicaid Other

## 2022-07-19 ENCOUNTER — Other Ambulatory Visit (HOSPITAL_COMMUNITY)
Admission: RE | Admit: 2022-07-19 | Discharge: 2022-07-19 | Disposition: A | Discharge status: Home / Self Care | Payer: Medicaid Other | Source: Ambulatory Visit | Attending: Obstetrics & Gynecology | Admitting: Obstetrics & Gynecology

## 2022-07-19 DIAGNOSIS — N898 Other specified noninflammatory disorders of vagina: Secondary | ICD-10-CM | POA: Diagnosis present

## 2022-07-19 DIAGNOSIS — N760 Acute vaginitis: Secondary | ICD-10-CM

## 2022-07-19 NOTE — Progress Notes (Signed)
SUBJECTIVE:  27 y.o. female complains of clear, malodorous, and thick vaginal discharge for 1 week(s). Denies abnormal vaginal bleeding or significant pelvic pain or fever. No UTI symptoms. Denies history of known exposure to STD.  No LMP recorded.  OBJECTIVE:  She appears well, afebrile. Urine dipstick: not done.  ASSESSMENT:  Vaginal Discharge  Vaginal Odor   PLAN:  GC, chlamydia, trichomonas, BVAG, CVAG probe sent to lab. Treatment: To be determined once lab results are received ROV prn if symptoms persist or worsen.

## 2022-07-20 LAB — CERVICOVAGINAL ANCILLARY ONLY
Bacterial Vaginitis (gardnerella): POSITIVE — AB
Candida Glabrata: NEGATIVE
Candida Vaginitis: NEGATIVE
Chlamydia: NEGATIVE
Comment: NEGATIVE
Comment: NEGATIVE
Comment: NEGATIVE
Comment: NEGATIVE
Comment: NEGATIVE
Comment: NORMAL
Neisseria Gonorrhea: NEGATIVE
Trichomonas: POSITIVE — AB

## 2022-07-25 MED ORDER — METRONIDAZOLE 500 MG PO TABS: 500.0000 mg | ORAL_TABLET | Freq: Two times a day (BID) | ORAL | 0 refills | Status: DC

## 2022-07-25 NOTE — Addendum Note (Signed)
Addended by: Woodroe Mode on: 07/25/2022 09:43 AM   Modules accepted: Orders

## 2022-08-30 ENCOUNTER — Ambulatory Visit: Payer: Medicaid Other

## 2022-09-05 ENCOUNTER — Ambulatory Visit: Payer: Medicaid Other

## 2023-04-26 ENCOUNTER — Other Ambulatory Visit (HOSPITAL_COMMUNITY)
Admission: RE | Admit: 2023-04-26 | Discharge: 2023-04-26 | Disposition: A | Discharge status: Home / Self Care | Payer: Medicaid Other | Source: Ambulatory Visit | Attending: Obstetrics and Gynecology | Admitting: Obstetrics and Gynecology

## 2023-04-26 ENCOUNTER — Ambulatory Visit (INDEPENDENT_AMBULATORY_CARE_PROVIDER_SITE_OTHER): Payer: Medicaid Other | Admitting: General Practice

## 2023-04-26 VITALS — BP 100/65 | HR 91 | Ht 61.0 in | Wt 111.6 lb

## 2023-04-26 DIAGNOSIS — N898 Other specified noninflammatory disorders of vagina: Secondary | ICD-10-CM | POA: Diagnosis present

## 2023-04-26 NOTE — Progress Notes (Signed)
SUBJECTIVE:  27 y.o. female complains of white and malodorous vaginal discharge for 1 week(s). Denies abnormal vaginal bleeding or significant pelvic pain or fever. No UTI symptoms. Denies history of known exposure to STD.  No LMP recorded.  OBJECTIVE:  She appears well, afebrile. Urine dipstick: not done.  ASSESSMENT:  Vaginal Discharge  Vaginal Odor   PLAN:  GC, chlamydia, trichomonas, BVAG, CVAG probe sent to lab. Treatment: To be determined once lab results are received ROV prn if symptoms persist or worsen.

## 2023-04-28 LAB — CERVICOVAGINAL ANCILLARY ONLY
Bacterial Vaginitis (gardnerella): POSITIVE — AB
Candida Glabrata: NEGATIVE
Candida Vaginitis: NEGATIVE
Chlamydia: NEGATIVE
Comment: NEGATIVE
Comment: NEGATIVE
Comment: NEGATIVE
Comment: NEGATIVE
Comment: NEGATIVE
Comment: NORMAL
Neisseria Gonorrhea: NEGATIVE
Trichomonas: NEGATIVE

## 2023-05-03 ENCOUNTER — Other Ambulatory Visit: Payer: Self-pay

## 2023-05-03 MED ORDER — METRONIDAZOLE 500 MG PO TABS: 500.0000 mg | ORAL_TABLET | Freq: Two times a day (BID) | ORAL | 0 refills | Status: DC

## 2023-05-03 NOTE — Progress Notes (Signed)
Pt called requested treatment for bv following + results on 04/26/23. Sent per protocol.

## 2023-05-24 NOTE — L&D Delivery Note (Signed)
 OB/[facility]N Faculty Practice Delivery Note  Teresa Johnson is a 28 y.o. G2P1001 s/p SVD at [redacted]w[redacted]d. She was admitted for SOL.   ROM: 1h 44m with clear fluid GBS Status: positive, adequate ppx given Maximum Maternal Temperature: 98.28F  Labor Progress: Initial SVE 5.5/80/-3, progressed with AROM, progressed to fully dilated/+3  Delivery Date/Time: 04/23/2024 1925 Delivery: Called to room and patient was complete and pushing. Head delivered LOA. No nuchal cord present. Shoulder and body delivered in usual fashion. Infant with spontaneous cry, placed on mother's abdomen, dried and stimulated. Cord clamped x 2 after 1-minute delay, and cut by father of baby. Cord blood drawn. Placenta delivered spontaneously, intact, with 3-vessel cord. Fundus firm with massage and Pitocin . Labia, perineum, vagina, and cervix inspected, no lacerations found.   Placenta: Intact, delivered spontaneously Complications: None Lacerations: None EBL: Analgesia: Epidural  Infant: Viable female  APGARs 9,9  2807g  Teresa DELENA Courts, MD 04/23/2024, 7:38 PM

## 2023-08-30 ENCOUNTER — Ambulatory Visit: Admitting: Obstetrics and Gynecology

## 2023-08-30 NOTE — Progress Notes (Deleted)
    GYNECOLOGY VISIT  Patient name: Teresa Johnson MRN [id number]  Date of birth: [birth year] Chief Complaint:   No chief complaint on file.   History:  Teresa Johnson is a 28 y.o. G1P1001 being seen today for ***.    10/13/21 BV +, Trich + 03/17/22 BV + 07/19/22 BV +, Trich + 04/26/23 BV + 11/02/22 BV +   Past Medical History:  Diagnosis Date   Medical history non-contributory     Past Surgical History:  Procedure Laterality Date   NO PAST SURGERIES      The following portions of the patient's history were reviewed and updated as appropriate: allergies, current medications, past family history, past medical history, past social history, past surgical history and problem list.   Health Maintenance:   Last pap (11/02/2022) NILM Last mammogram: n/a   Review of Systems:  {Ros - complete:30496} Comprehensive review of systems was otherwise negative.   Objective:  Physical Exam There were no vitals taken for this visit.   Physical Exam   Labs and Imaging No results found.     Assessment & Plan:   There are no diagnoses linked to this encounter.   *** Routine preventative health maintenance measures emphasized.  Lorriane Shire, MD Minimally Invasive Gynecologic Surgery [facility], [facility]

## 2023-09-01 ENCOUNTER — Inpatient Hospital Stay (HOSPITAL_COMMUNITY)
Admission: AD | Admit: 2023-09-01 | Discharge: 2023-09-01 | Disposition: A | Discharge status: Home / Self Care | Attending: Obstetrics & Gynecology | Admitting: Obstetrics & Gynecology

## 2023-09-01 ENCOUNTER — Inpatient Hospital Stay (HOSPITAL_COMMUNITY)

## 2023-09-01 ENCOUNTER — Encounter (HOSPITAL_COMMUNITY): Payer: Self-pay | Admitting: Obstetrics & Gynecology

## 2023-09-01 DIAGNOSIS — O468X1 Other antepartum hemorrhage, first trimester: Secondary | ICD-10-CM | POA: Diagnosis not present

## 2023-09-01 DIAGNOSIS — O418X10 Other specified disorders of amniotic fluid and membranes, first trimester, not applicable or unspecified: Secondary | ICD-10-CM

## 2023-09-01 DIAGNOSIS — Z3A01 Less than 8 weeks gestation of pregnancy: Secondary | ICD-10-CM | POA: Insufficient documentation

## 2023-09-01 DIAGNOSIS — R1030 Lower abdominal pain, unspecified: Secondary | ICD-10-CM | POA: Diagnosis not present

## 2023-09-01 DIAGNOSIS — O208 Other hemorrhage in early pregnancy: Secondary | ICD-10-CM | POA: Insufficient documentation

## 2023-09-01 DIAGNOSIS — O26891 Other specified pregnancy related conditions, first trimester: Secondary | ICD-10-CM | POA: Insufficient documentation

## 2023-09-01 DIAGNOSIS — R1032 Left lower quadrant pain: Secondary | ICD-10-CM | POA: Diagnosis not present

## 2023-09-01 LAB — CBC
HCT: 34.9 % — ABNORMAL LOW (ref 36.0–46.0)
Hemoglobin: 11.7 g/dL — ABNORMAL LOW (ref 12.0–15.0)
MCH: 30.5 pg (ref 26.0–34.0)
MCHC: 33.5 g/dL (ref 30.0–36.0)
MCV: 91.1 fL (ref 80.0–100.0)
Platelets: 262 10*3/uL (ref 150–400)
RBC: 3.83 MIL/uL — ABNORMAL LOW (ref 3.87–5.11)
RDW: 12.5 % (ref 11.5–15.5)
WBC: 9.4 10*3/uL (ref 4.0–10.5)
nRBC: 0 % (ref 0.0–0.2)

## 2023-09-01 LAB — URINALYSIS, ROUTINE W REFLEX MICROSCOPIC
Bilirubin Urine: NEGATIVE
Glucose, UA: NEGATIVE mg/dL
Hgb urine dipstick: NEGATIVE
Ketones, ur: 5 mg/dL — AB
Leukocytes,Ua: NEGATIVE
Nitrite: NEGATIVE
Protein, ur: NEGATIVE mg/dL
Specific Gravity, Urine: 1.026 (ref 1.005–1.030)
pH: 5 (ref 5.0–8.0)

## 2023-09-01 LAB — WET PREP, GENITAL
Clue Cells Wet Prep HPF POC: NONE SEEN
Sperm: NONE SEEN
Trich, Wet Prep: NONE SEEN
WBC, Wet Prep HPF POC: <10 (ref <10)
Yeast Wet Prep HPF POC: NONE SEEN

## 2023-09-01 LAB — ABO/RH: ABO/RH(D): AB POS

## 2023-09-01 LAB — HCG, QUANTITATIVE, PREGNANCY: hCG, Beta Chain, Quant, S: 2542 m[IU]/mL — ABNORMAL HIGH (ref <5)

## 2023-09-01 LAB — POCT PREGNANCY, URINE: Preg Test, Ur: POSITIVE — AB

## 2023-09-01 NOTE — MAU Provider Note (Addendum)
 History    Chief Complaint  Patient presents with   Abdominal Pain   Teresa Johnson , a  28 y.o. G2P1001 at [redacted]w[redacted]d presents to MAU with complaints of lower left-sided abdominal pain in the presence of a pregnancy test dizziness 7 PM.  Patient reports ongoing pain left lower quadrant for the last week.  Patient reports pain was 7 out of 10 but denies attempting to relieve symptoms at this time patient currently reports pain 0 out of 10.  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  She denies abnormal vaginal bleeding abnormal vaginal discharge or urinary symptoms.       Abdominal Pain Pertinent negatives include no constipation, diarrhea, dysuria, fever, headaches, nausea or vomiting.    OB History     Gravida  2   Para  1   Term  1   Preterm      AB      Living  1      SAB      IAB      Ectopic      Multiple  0   Live Births  1           Past Medical History:  Diagnosis Date   Medical history non-contributory     Past Surgical History:  Procedure Laterality Date   NO PAST SURGERIES      Family History  Problem Relation Age of Onset   Diabetes Mother     Social History   Tobacco Use   Smoking status: Every Day    Types: Cigarettes, Cigars   Smokeless tobacco: Never   Tobacco comments:    Black and mild  Vaping Use   Vaping status: Never Used  Substance Use Topics   Alcohol use: No   Drug use: Yes    Types: Marijuana    Allergies: No Known Allergies  No medications prior to admission.    Review of Systems  Constitutional:  Negative for chills, fatigue and fever.  Eyes:  Negative for pain and visual disturbance.  Respiratory:  Negative for apnea, shortness of breath and wheezing.   Cardiovascular:  Negative for chest pain and palpitations.  Gastrointestinal:  Positive for abdominal pain. Negative for constipation, diarrhea, nausea and vomiting.  Genitourinary:  Negative for difficulty urinating, dysuria, pelvic  pain, vaginal bleeding, vaginal discharge and vaginal pain.  Musculoskeletal:  Negative for back pain.  Neurological:  Negative for seizures, weakness and headaches.  Psychiatric/Behavioral:  Negative for suicidal ideas.    Physical Exam Blood pressure (!) 94/59, pulse 89, temperature 98.6 F (37 C), temperature source Oral, resp. rate 12, height 5\' 1"  (1.549 m), weight 49.1 kg, last menstrual period 07/27/2023, unknown if currently breastfeeding. Physical Exam Vitals and nursing note reviewed.  Constitutional:      General: She is not in acute distress.    Appearance: Normal appearance.  HENT:     Head: Normocephalic.  Pulmonary:     Effort: Pulmonary effort is normal.  Abdominal:     Tenderness: There is no abdominal tenderness. There is no guarding.  Musculoskeletal:     Cervical back: Normal range of motion.  Skin:    General: Skin is warm and dry.  Neurological:     Mental Status: She is alert and oriented to person, place, and time.  Psychiatric:        Mood and Affect: Mood normal.     MAU Course Procedures Orders Placed This Encounter  Procedures  Wet prep, genital   US  OB LESS THAN 14 WEEKS WITH OB TRANSVAGINAL   Urinalysis, Routine w reflex microscopic -Urine, Clean Catch   CBC   hCG, quantitative, pregnancy   Diet NPO time specified   Pregnancy, urine POC   ABO/Rh   Discharge patient Discharge disposition: 01-Home or Self Care; Discharge patient date: 09/01/2023   Results for orders placed or performed during the hospital encounter of 09/01/23 (from the past 24 hours)  Pregnancy, urine POC     Status: Abnormal   Collection Time: 09/01/23  8:15 PM  Result Value Ref Range   Preg Test, Ur POSITIVE (A) NEGATIVE  Urinalysis, Routine w reflex microscopic -Urine, Clean Catch     Status: Abnormal   Collection Time: 09/01/23  8:16 PM  Result Value Ref Range   Color, Urine YELLOW YELLOW   APPearance HAZY (A) CLEAR   Specific Gravity, Urine 1.026 1.005 - 1.030    pH 5.0 5.0 - 8.0   Glucose, UA NEGATIVE NEGATIVE mg/dL   Hgb urine dipstick NEGATIVE NEGATIVE   Bilirubin Urine NEGATIVE NEGATIVE   Ketones, ur 5 (A) NEGATIVE mg/dL   Protein, ur NEGATIVE NEGATIVE mg/dL   Nitrite NEGATIVE NEGATIVE   Leukocytes,Ua NEGATIVE NEGATIVE  Wet prep, genital     Status: None   Collection Time: 09/01/23  8:18 PM  Result Value Ref Range   Yeast Wet Prep HPF POC NONE SEEN NONE SEEN   Trich, Wet Prep NONE SEEN NONE SEEN   Clue Cells Wet Prep HPF POC NONE SEEN NONE SEEN   WBC, Wet Prep HPF POC <10 <10   Sperm NONE SEEN   CBC     Status: Abnormal   Collection Time: 09/01/23  9:00 PM  Result Value Ref Range   WBC 9.4 4.0 - 10.5 K/uL   RBC 3.83 (L) 3.87 - 5.11 MIL/uL   Hemoglobin 11.7 (L) 12.0 - 15.0 g/dL   HCT 09.8 (L) 11.9 - 14.7 %   MCV 91.1 80.0 - 100.0 fL   MCH 30.5 26.0 - 34.0 pg   MCHC 33.5 30.0 - 36.0 g/dL   RDW 82.9 56.2 - 13.0 %   Platelets 262 150 - 400 K/uL   nRBC 0.0 0.0 - 0.2 %  ABO/Rh     Status: None   Collection Time: 09/01/23  9:00 PM  Result Value Ref Range   ABO/RH(D) AB POS    No rh immune globuloin      NOT A RH IMMUNE GLOBULIN CANDIDATE, PT RH POSITIVE Performed at [facility], [address], [place], [place] [address]   hCG, quantitative, pregnancy     Status: Abnormal   Collection Time: 09/01/23  9:00 PM  Result Value Ref Range   hCG, Beta Chain, Quant, S 2,542 (H) <5 mIU/mL   US  OB LESS THAN 14 WEEKS WITH OB TRANSVAGINAL Result Date: 09/01/2023 CLINICAL DATA:  [id number] Abdominal pain [id number] EXAM: OBSTETRIC <14 WK US  AND TRANSVAGINAL OB US  TECHNIQUE: Both transabdominal and transvaginal ultrasound examinations were performed for complete evaluation of the gestation as well as the maternal uterus, adnexal regions, and pelvic cul-de-sac. Transvaginal technique was performed to assess early pregnancy. COMPARISON:  None Available. FINDINGS: Intrauterine gestational sac: Single Yolk sac:  Not Visualized. Embryo:  Not Visualized.  Cardiac Activity: Not Visualized. MSD: 4.7 mm   5 w   2 d Subchorionic hemorrhage:  Small volume. Maternal uterus/adnexae: Bilateral ovaries are unremarkable. IMPRESSION: 1. Probable early intrauterine gestational sac, but no yolk  sac, fetal pole, or cardiac activity yet visualized. Recommend follow-up quantitative B-HCG levels and follow-up US  in 14 days to assess viability. This recommendation follows SRU consensus guidelines: Diagnostic Criteria for Nonviable Pregnancy Early in the First Trimester. Mel Spine Med 2013; 409:8119-14. 2. Small volume subchorionic hemorrhage. Electronically Signed   By: Morgane  Naveau M.D.   On: 09/01/2023 22:21    MDM -Mild dehydration noted on UA -Wet prep normal GC pending upon discharge -hCG greater than 2000 consistent with last menstrual period of about 5 weeks. -Ultrasound results reveal a probable early IUP but no yolk sac or fetal pole.  Low suspicion for ectopic pregnancy - Recommendation for viability scan in 2 to 4 weeks.  Of note also a small subchorionic hemorrhage noted on ultrasound. -Plan for discharge.  1. Lower abdominal pain   2. [redacted] weeks gestation of pregnancy   3. Less than [redacted] weeks gestation of pregnancy   4. Subchorionic hematoma in first trimester, single or unspecified fetus    - reviewed pregnancy consistent with dates of approximately [redacted] weeks gestation. -Discussed the subchorionic hematoma in the first trimester and bleeding expectations -Message sent to [facility] to have patient scheduled for viability scan in 2 to 4 weeks -Reviewed worsening signs and return precautions. -Patient discharged home in stable condition and may return to MAU as needed.   Johnanthony Wilden Maurie Southern) Marlys Singh, MSN, CNM  Center for [facility]  09/01/2023 10:43 PM

## 2023-09-01 NOTE — MAU Note (Signed)
 Pt says she did UPT 7pm- positive-  Says don't feel good  On Wed - had vomiting  Says has lower abd pain - on Wed - now  pain 7/10 No meds for pain .

## 2023-09-01 NOTE — Discharge Instructions (Signed)

## 2023-09-04 LAB — GC/CHLAMYDIA PROBE AMP (~~LOC~~) NOT AT ARMC
Chlamydia: NEGATIVE
Comment: NEGATIVE
Comment: NORMAL
Neisseria Gonorrhea: NEGATIVE

## 2023-09-19 ENCOUNTER — Encounter (HOSPITAL_COMMUNITY): Payer: Self-pay | Admitting: Obstetrics and Gynecology

## 2023-09-19 ENCOUNTER — Inpatient Hospital Stay (HOSPITAL_COMMUNITY)
Admission: AD | Admit: 2023-09-19 | Discharge: 2023-09-19 | Disposition: A | Discharge status: Home / Self Care | Attending: Obstetrics and Gynecology | Admitting: Obstetrics and Gynecology

## 2023-09-19 DIAGNOSIS — Z3A01 Less than 8 weeks gestation of pregnancy: Secondary | ICD-10-CM

## 2023-09-19 DIAGNOSIS — O210 Mild hyperemesis gravidarum: Secondary | ICD-10-CM

## 2023-09-19 HISTORY — DX: Unspecified asthma, uncomplicated: J45.909

## 2023-09-19 LAB — URINALYSIS, ROUTINE W REFLEX MICROSCOPIC
Bilirubin Urine: NEGATIVE
Glucose, UA: NEGATIVE mg/dL
Hgb urine dipstick: NEGATIVE
Ketones, ur: 5 mg/dL — AB
Leukocytes,Ua: NEGATIVE
Nitrite: NEGATIVE
Protein, ur: NEGATIVE mg/dL
Specific Gravity, Urine: 1.011 (ref 1.005–1.030)
pH: 7 (ref 5.0–8.0)

## 2023-09-19 MED ORDER — PROMETHAZINE HCL 25 MG PO TABS: 25.0000 mg | ORAL_TABLET | Freq: Four times a day (QID) | ORAL | 3 refills | Status: DC | PRN

## 2023-09-19 MED ORDER — ONDANSETRON 4 MG PO TBDP: 4.0000 mg | ORAL_TABLET | Freq: Three times a day (TID) | ORAL | 3 refills | Status: DC | PRN

## 2023-09-19 MED ORDER — ONDANSETRON 4 MG PO TBDP
8.0000 mg | ORAL_TABLET | Freq: Once | ORAL | Status: AC
  Administered 2023-09-19: 8 mg via ORAL
  Filled 2023-09-19: qty 2

## 2023-09-19 MED ORDER — SCOPOLAMINE 1 MG/3DAYS TD PT72
1.0000 | MEDICATED_PATCH | Freq: Once | TRANSDERMAL | Status: DC
  Administered 2023-09-19: 1.5 mg via TRANSDERMAL
  Filled 2023-09-19: qty 1

## 2023-09-19 NOTE — MAU Provider Note (Signed)
 History     CSN: [id number]  Arrival date and time: 09/19/23 1309   Event Date/Time   First Provider Initiated Contact with Patient 09/19/23 1352      Chief Complaint  Patient presents with   Nausea   Emesis   HPI [name] is a 28 y.o. year old G85P1001 female at [redacted]w[redacted]d weeks gestation who presents to MAU reporting she has been "very nauseated". She threw up once last night at work. She reports she seems more nauseated at night. 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 She has been having hot & cold flashes with the N/V episodes. She reports not being able to keep anything down, but has not taken any medications for N/V. She is sipping on a Coke at the time of being triaged and denies any vomiting today. She denies any pain, VB or vaginal d/c. She reports a mild H/A last night, but none now. She plans to receive [facility] with [facility]; next appt is 09/26/2023 for an U/S.   OB History     Gravida  2   Para  1   Term  1   Preterm      AB      Living  1      SAB      IAB      Ectopic      Multiple  0   Live Births  1           Past Medical History:  Diagnosis Date   Asthma    seasonal allergy related    Past Surgical History:  Procedure Laterality Date   NO PAST SURGERIES      Family History  Problem Relation Age of Onset   Diabetes Mother    Seizures Father     Social History   Tobacco Use   Smoking status: Every Day    Types: Cigars   Smokeless tobacco: Never   Tobacco comments:    Black and mild  Vaping Use   Vaping status: Former  Substance Use Topics   Alcohol use: No   Drug use: Not Currently    Types: Marijuana    Comment: beginning of April    Allergies: No Known Allergies  No medications prior to admission.    Review of Systems  Constitutional:  Positive for appetite change.  HENT: Negative.    Eyes: Negative.   Respiratory: Negative.    Cardiovascular: Negative.   Gastrointestinal:  Positive for nausea. Negative  for vomiting.  Endocrine: Negative.   Musculoskeletal: Negative.   Skin: Negative.   Allergic/Immunologic: Negative.   Neurological: Negative.   Hematological: Negative.   Psychiatric/Behavioral: Negative.     Physical Exam   Blood pressure 98/65, pulse 71, temperature 98 F (36.7 C), temperature source Oral, resp. rate 15, height 5\' 1"  (1.549 m), weight 50 kg, last menstrual period 07/27/2023, SpO2 100%, unknown if currently breastfeeding.  Physical Exam Vitals and nursing note reviewed.  Constitutional:      Appearance: Normal appearance. She is normal weight.  Cardiovascular:     Rate and Rhythm: Normal rate.  Pulmonary:     Effort: Pulmonary effort is normal.  Musculoskeletal:        General: Normal range of motion.  Skin:    General: Skin is warm and dry.  Neurological:     Mental Status: She is alert and oriented to person, place, and time.  Psychiatric:  Mood and Affect: Mood normal.        Behavior: Behavior normal.        Thought Content: Thought content normal.        Judgment: Judgment normal.     MAU Course  Procedures  MDM CCUA Zofran  8 mg ODT -- nausea resolved Scope Patch -- nausea resolved   Results for orders placed or performed during the hospital encounter of 09/19/23 (from the past 24 hours)  Urinalysis, Routine w reflex microscopic -Urine, Clean Catch     Status: Abnormal   Collection Time: 09/19/23  1:32 PM  Result Value Ref Range   Color, Urine YELLOW YELLOW   APPearance HAZY (A) CLEAR   Specific Gravity, Urine 1.011 1.005 - 1.030   pH 7.0 5.0 - 8.0   Glucose, UA NEGATIVE NEGATIVE mg/dL   Hgb urine dipstick NEGATIVE NEGATIVE   Bilirubin Urine NEGATIVE NEGATIVE   Ketones, ur 5 (A) NEGATIVE mg/dL   Protein, ur NEGATIVE NEGATIVE mg/dL   Nitrite NEGATIVE NEGATIVE   Leukocytes,Ua NEGATIVE NEGATIVE     Assessment and Plan  1. Morning sickness (Primary) - Prescription for: Zofran  8 mg ODT every 8 hours prn N/V - Information  provided on morning sickness   2. [redacted] weeks gestation of pregnancy - Information provided on prenatal care and prenatal U/S  - Discharge home - Patient verbalized an understanding of the plan of care and agrees.   Almond Army, CNM 09/19/2023, 2:09 PM

## 2023-09-19 NOTE — MAU Note (Signed)
 Teresa Johnson is a 28 y.o. at [redacted]w[redacted]d here in MAU reporting: been very nauseous.  Threw up last night at work.  Seems to be more nauseated at night, works night.  Has been having hot and cold flashes - coincides with the nausea and vomiting episodes. Can't keep anything down.  Not currently using any meds for nausea.  No abd pain, vag bleeding or d/c. Has been having headaches- last night, not currently. Onset of complaint: 2 days ago Pain score: none Vitals:   09/19/23 1329  BP: 102/62  Pulse: 85  Resp: 16  Temp: 98 F (36.7 C)  SpO2: 100%      Lab orders placed from triage:  urine

## 2023-09-19 NOTE — Discharge Instructions (Signed)

## 2023-09-26 ENCOUNTER — Other Ambulatory Visit

## 2023-10-04 ENCOUNTER — Other Ambulatory Visit (INDEPENDENT_AMBULATORY_CARE_PROVIDER_SITE_OTHER): Payer: Self-pay

## 2023-10-04 ENCOUNTER — Ambulatory Visit: Admitting: *Deleted

## 2023-10-04 ENCOUNTER — Other Ambulatory Visit (HOSPITAL_COMMUNITY)
Admission: RE | Admit: 2023-10-04 | Discharge: 2023-10-04 | Disposition: A | Discharge status: Home / Self Care | Source: Ambulatory Visit | Attending: Obstetrics and Gynecology | Admitting: Obstetrics and Gynecology

## 2023-10-04 VITALS — BP 89/56 | HR 89 | Wt 113.4 lb

## 2023-10-04 DIAGNOSIS — O3680X0 Pregnancy with inconclusive fetal viability, not applicable or unspecified: Secondary | ICD-10-CM

## 2023-10-04 DIAGNOSIS — Z3480 Encounter for supervision of other normal pregnancy, unspecified trimester: Secondary | ICD-10-CM | POA: Insufficient documentation

## 2023-10-04 DIAGNOSIS — Z3481 Encounter for supervision of other normal pregnancy, first trimester: Secondary | ICD-10-CM

## 2023-10-04 DIAGNOSIS — Z3A08 8 weeks gestation of pregnancy: Secondary | ICD-10-CM | POA: Diagnosis not present

## 2023-10-04 DIAGNOSIS — Z1339 Encounter for screening examination for other mental health and behavioral disorders: Secondary | ICD-10-CM | POA: Diagnosis not present

## 2023-10-04 DIAGNOSIS — Z3A09 9 weeks gestation of pregnancy: Secondary | ICD-10-CM | POA: Diagnosis not present

## 2023-10-04 MED ORDER — BLOOD PRESSURE KIT DEVI: 1.0000 | 0 refills | Status: DC

## 2023-10-04 NOTE — Progress Notes (Signed)
 New OB Intake  I connected with Teresa Johnson  on 10/04/23 at  2:10 PM EDT by In Person Visit and verified that I am speaking with the correct person using two identifiers. Nurse is located at [facility] and pt is located at [place].  I discussed the limitations, risks, security and privacy concerns of performing an evaluation and management service by telephone and the availability of in person appointments. I also discussed with the patient that there may be a patient responsible charge related to this service. The patient expressed understanding and agreed to proceed.  I explained I am completing New OB Intake today. We discussed EDD of 05/02/2024, by Last Menstrual Period. Pt is G2P1001. I reviewed her allergies, medications and Medical/Surgical/OB history.    Patient Active Problem List   Diagnosis Date Noted   Maternal varicella, non-immune 02/04/2017     Concerns addressed today  Delivery Plans Plans to deliver at [facility]. Discussed the nature of our practice with multiple providers including residents and students. Due to the size of the practice, the delivering provider may not be the same as those providing prenatal care.   Patient is interested in water birth.  MyChart/Babyscripts MyChart access verified. I explained pt will have some visits in office and some virtually. Babyscripts instructions given and order placed. Patient verifies receipt of registration text/e-mail. Account successfully created and app downloaded. If patient is a candidate for Optimized scheduling, add to sticky note.   Blood Pressure Cuff/Weight Scale Blood pressure cuff ordered for patient to pick-up from [employer]. Explained after first prenatal appt pt will check weekly and document in Babyscripts. Patient does not have weight scale; patient may purchase if they desire to track weight weekly in Babyscripts.  Anatomy US  Explained first scheduled US  will be around 19 weeks. Anatomy US  scheduled  for TBD at TBD.  Is patient a candidate for Babyscripts Optimization? No, due to Risk Factors   First visit review I reviewed new OB appt with patient. Explained pt will be seen by Dr. Dodie Frees at first visit. Discussed [facility] [place] genetic screening with patient. Requests Pan[employer] and [employer].. Routine prenatal labs OB Urine, GC/CC only collected at today's visit. Initial OB labs deferred to New OB appt.   Last Pap Diagnosis  Date Value Ref Range Status  03/17/2022   Final   - Negative for intraepithelial lesion or malignancy (NILM)    Donette Furlong, RN 10/04/2023  2:50 PM

## 2023-10-04 NOTE — Patient Instructions (Signed)

## 2023-10-05 LAB — CERVICOVAGINAL ANCILLARY ONLY
Bacterial Vaginitis (gardnerella): POSITIVE — AB
Candida Glabrata: NEGATIVE
Candida Vaginitis: NEGATIVE
Chlamydia: NEGATIVE
Comment: NEGATIVE
Comment: NEGATIVE
Comment: NEGATIVE
Comment: NEGATIVE
Comment: NEGATIVE
Comment: NORMAL
Neisseria Gonorrhea: NEGATIVE
Trichomonas: NEGATIVE

## 2023-10-06 LAB — CULTURE, OB URINE

## 2023-10-06 LAB — URINE CULTURE, OB REFLEX

## 2023-10-11 ENCOUNTER — Ambulatory Visit: Payer: Self-pay | Admitting: Obstetrics and Gynecology

## 2023-10-11 DIAGNOSIS — B9689 Other specified bacterial agents as the cause of diseases classified elsewhere: Secondary | ICD-10-CM

## 2023-10-11 MED ORDER — METRONIDAZOLE 500 MG PO TABS: 500.0000 mg | ORAL_TABLET | Freq: Two times a day (BID) | ORAL | 0 refills | Status: AC

## 2023-10-25 ENCOUNTER — Ambulatory Visit (INDEPENDENT_AMBULATORY_CARE_PROVIDER_SITE_OTHER): Admitting: Obstetrics and Gynecology

## 2023-10-25 ENCOUNTER — Encounter: Payer: Self-pay | Admitting: Obstetrics and Gynecology

## 2023-10-25 VITALS — BP 98/60 | HR 101 | Wt 111.0 lb

## 2023-10-25 DIAGNOSIS — Z3A12 12 weeks gestation of pregnancy: Secondary | ICD-10-CM | POA: Diagnosis not present

## 2023-10-25 DIAGNOSIS — Z141 Cystic fibrosis carrier: Secondary | ICD-10-CM

## 2023-10-25 DIAGNOSIS — O99341 Other mental disorders complicating pregnancy, first trimester: Secondary | ICD-10-CM

## 2023-10-25 DIAGNOSIS — Z3480 Encounter for supervision of other normal pregnancy, unspecified trimester: Secondary | ICD-10-CM

## 2023-10-25 DIAGNOSIS — F419 Anxiety disorder, unspecified: Secondary | ICD-10-CM

## 2023-10-25 DIAGNOSIS — Z31430 Encounter of female for testing for genetic disease carrier status for procreative management: Secondary | ICD-10-CM

## 2023-10-25 DIAGNOSIS — Z3481 Encounter for supervision of other normal pregnancy, first trimester: Secondary | ICD-10-CM

## 2023-10-25 DIAGNOSIS — Z3482 Encounter for supervision of other normal pregnancy, second trimester: Secondary | ICD-10-CM

## 2023-10-25 MED ORDER — VITAFOL GUMMIES 3.33-0.333-34.8 MG PO CHEW: 2.0000 | CHEWABLE_TABLET | Freq: Every day | ORAL | 6 refills | Status: AC

## 2023-10-25 NOTE — Patient Instructions (Signed)
   Considering Waterbirth? Guide for patients at Center for [facility]) Why consider waterbirth? Gentle birth for babies  Less pain medicine used in labor  May allow for passive descent/less pushing  May reduce perineal tears  More mobility and instinctive maternal position changes  Increased maternal relaxation   Is waterbirth safe? What are the risks of infection, drowning or other complications? Infection:  Very low risk (3.7 % for tub vs 4.8% for bed)  7 in 8000 waterbirths with documented infection  Poorly cleaned equipment most common cause  Slightly lower group B strep transmission rate  Drowning  Maternal:  Very low risk  Related to seizures or fainting  Newborn:  Very low risk. No evidence of increased risk of respiratory problems in multiple large studies  Physiological protection from breathing under water  Avoid underwater birth if there are any fetal complications  Once baby's head is out of the water, keep it out.  Birth complication  Some reports of cord trauma, but risk decreased by bringing baby to surface gradually  No evidence of increased risk of shoulder dystocia. Mothers can usually change positions faster in water than in a bed, possibly aiding the maneuvers to free the shoulder.   There are 2 things you MUST do to have a waterbirth with [facility]: Attend a waterbirth class at [facility]   3rd Wednesday of every month from 7-9 pm (virtual during COVID) [employer] at [email or web address] or [email or web address] or by calling [phone] Bring [place] the certificate from the class to your prenatal appointment or send via MyChart Meet with a midwife at 36 weeks* to see if you can still plan a waterbirth and to sign the consent.   *We also recommend that you schedule as many of your prenatal visits with a midwife as possible.    Helpful information: You may want to bring a bathing suit top to the hospital  to wear during labor but this is optional.  All other supplies are provided by the hospital. Please arrive at the hospital with signs of active labor, and do not wait at home until late in labor. It takes 45 min- 1 hour for fetal monitoring, and check in to your room to take place, plus transport and filling of the waterbirth tub.    Things that would prevent you from having a waterbirth: Premature, <37wks  Previous cesarean birth  Presence of thick meconium-stained fluid  Multiple gestation (Twins, triplets, etc.)  Uncontrolled diabetes or gestational diabetes requiring medication  Hypertension diagnosed in pregnancy or preexisting hypertension (gestational hypertension, preeclampsia, or chronic hypertension) Fetal growth restriction (your baby measures less than 10th percentile on ultrasound) Heavy vaginal bleeding  Non-reassuring fetal heart rate  Active infection (MRSA, etc.). Group B Strep is NOT a contraindication for waterbirth.  If your labor has to be induced and induction method requires continuous monitoring of the baby's heart rate  Other risks/issues identified by your obstetrical provider   Please remember that birth is unpredictable. Under certain unforeseeable circumstances your provider may advise against giving birth in the tub. These decisions will be made on a case-by-case basis and with the safety of you and your baby as our highest priority.    Updated 08/25/21

## 2023-10-25 NOTE — Progress Notes (Signed)
 Pt has dental request form to complete, will also give dental letter.

## 2023-10-25 NOTE — Progress Notes (Signed)
  Subjective:    Teresa Johnson is a G2P1001 [redacted]w[redacted]d being seen today for her first obstetrical visit.  Her obstetrical history is significant for previous uncomplicated low risk pregnancy. Patient does intend to breast feed. Pregnancy history fully reviewed.  Patient reports no complaints.  Vitals:   10/25/23 0947  BP: 98/60  Pulse: (!) 101  Weight: 111 lb (50.3 kg)    HISTORY: OB History  Gravida Para Term Preterm AB Living  2 1 1  0 0 1  SAB IAB Ectopic Multiple Live Births  0 0 0 0 1    # Outcome Date GA Lbr Len/2nd Weight Sex Type Anes PTL Lv  2 Current           1 Term 07/03/17 [redacted]w[redacted]d 07:51 / 00:22 6 lb 1.5 oz (2.765 kg) F Vag-Spont EPI  LIV   Past Medical History:  Diagnosis Date   Asthma    seasonal allergy related   Past Surgical History:  Procedure Laterality Date   NO PAST SURGERIES     Family History  Problem Relation Age of Onset   Hypertension Mother    Diabetes Mother    Seizures Father      Exam    System: Breast:  normal appearance, no masses or tenderness   Skin: normal coloration and turgor, no rashes    Neurologic: oriented, no focal deficits   Extremities: normal strength, tone, and muscle mass   HEENT extra ocular movement intact   Mouth/Teeth mucous membranes moist, pharynx normal without lesions and dental hygiene good   Neck supple and no masses   Cardiovascular: regular rate and rhythm   Respiratory:  appears well, vitals normal, no respiratory distress, acyanotic, normal RR, chest clear, no wheezing, crepitations, rhonchi, normal symmetric air entry   Abdomen: soft, non-tender; bowel sounds normal; no masses,  no organomegaly   Urinary:       Assessment:    Pregnancy: G2P1001 Patient Active Problem List   Diagnosis Date Noted   Supervision of other normal pregnancy, antepartum 10/04/2023   Maternal varicella, non-immune 02/04/2017        Plan:     Initial labs drawn. Prenatal vitamins. Problem list reviewed and  updated. Genetic Screening discussed : ordered.  Ultrasound discussed; fetal survey: ordered. Patient interested in waterbirth- information provided. Patient aware of class completion requirement  Follow up in 4 weeks. 50% of 30 min visit spent on counseling and coordination of care.     Calle Schader 10/25/2023

## 2023-10-26 ENCOUNTER — Ambulatory Visit: Payer: Self-pay | Admitting: Obstetrics and Gynecology

## 2023-10-26 DIAGNOSIS — Z3480 Encounter for supervision of other normal pregnancy, unspecified trimester: Secondary | ICD-10-CM

## 2023-10-26 LAB — CBC/D/PLT+RPR+RH+ABO+RUBIGG...
Antibody Screen: NEGATIVE
Basophils Absolute: 0.1 x10E3/uL (ref 0.0–0.2)
Basos: 1 %
EOS (ABSOLUTE): 0.2 x10E3/uL (ref 0.0–0.4)
Eos: 2 %
HCV Ab: NONREACTIVE
HIV Screen 4th Generation wRfx: NONREACTIVE
Hematocrit: 36.7 % (ref 34.0–46.6)
Hemoglobin: 11.9 g/dL (ref 11.1–15.9)
Hepatitis B Surface Ag: NEGATIVE
Immature Grans (Abs): 0 x10E3/uL (ref 0.0–0.1)
Immature Granulocytes: 0 %
Lymphocytes Absolute: 1.8 x10E3/uL (ref 0.7–3.1)
Lymphs: 21 %
MCH: 30.4 pg (ref 26.6–33.0)
MCHC: 32.4 g/dL (ref 31.5–35.7)
MCV: 94 fL (ref 79–97)
Monocytes Absolute: 0.5 x10E3/uL (ref 0.1–0.9)
Monocytes: 5 %
Neutrophils Absolute: 5.9 x10E3/uL (ref 1.4–7.0)
Neutrophils: 71 %
Platelets: 279 x10E3/uL (ref 150–450)
RBC: 3.92 x10E6/uL (ref 3.77–5.28)
RDW: 12.4 % (ref 11.7–15.4)
RPR Ser Ql: NONREACTIVE
Rh Factor: POSITIVE
Rubella Antibodies, IGG: 1.68 {index}
WBC: 8.3 x10E3/uL (ref 3.4–10.8)

## 2023-10-26 LAB — HCV INTERPRETATION

## 2023-10-26 LAB — HEMOGLOBIN A1C
Est. average glucose Bld gHb Est-mCnc: 105 mg/dL
Hgb A1c MFr Bld: 5.3 % (ref 4.8–5.6)

## 2023-10-30 LAB — PANORAMA PRENATAL TEST FULL PANEL:PANORAMA TEST PLUS 5 ADDITIONAL MICRODELETIONS: FETAL FRACTION: 8.3

## 2023-10-30 NOTE — Progress Notes (Signed)
 Upon chart review PHQ 9=5, GAD 7=10. [facility] referral placed.

## 2023-10-30 NOTE — Addendum Note (Signed)
 Addended by: Mirca Yale M on: 10/30/2023 05:37 PM   Modules accepted: Orders

## 2023-11-02 LAB — HORIZON CUSTOM: REPORT SUMMARY: POSITIVE — AB

## 2023-11-07 DIAGNOSIS — Z141 Cystic fibrosis carrier: Secondary | ICD-10-CM | POA: Insufficient documentation

## 2023-11-07 NOTE — Addendum Note (Signed)
 Addended by: Verlyn Goad on: 11/07/2023 01:18 PM   Modules accepted: Orders

## 2023-11-17 ENCOUNTER — Other Ambulatory Visit: Payer: Self-pay | Admitting: Obstetrics and Gynecology

## 2023-11-17 DIAGNOSIS — Z3480 Encounter for supervision of other normal pregnancy, unspecified trimester: Secondary | ICD-10-CM

## 2023-11-22 ENCOUNTER — Ambulatory Visit: Admitting: Obstetrics and Gynecology

## 2023-11-22 ENCOUNTER — Other Ambulatory Visit (HOSPITAL_COMMUNITY)
Admission: RE | Admit: 2023-11-22 | Discharge: 2023-11-22 | Disposition: A | Discharge status: Home / Self Care | Source: Ambulatory Visit | Attending: Obstetrics and Gynecology | Admitting: Obstetrics and Gynecology

## 2023-11-22 ENCOUNTER — Encounter: Payer: Self-pay | Admitting: Obstetrics and Gynecology

## 2023-11-22 VITALS — BP 91/50 | HR 86 | Wt 116.4 lb

## 2023-11-22 DIAGNOSIS — Z141 Cystic fibrosis carrier: Secondary | ICD-10-CM

## 2023-11-22 DIAGNOSIS — Z3A16 16 weeks gestation of pregnancy: Secondary | ICD-10-CM

## 2023-11-22 DIAGNOSIS — Z3482 Encounter for supervision of other normal pregnancy, second trimester: Secondary | ICD-10-CM

## 2023-11-22 DIAGNOSIS — N898 Other specified noninflammatory disorders of vagina: Secondary | ICD-10-CM | POA: Diagnosis present

## 2023-11-22 DIAGNOSIS — Z3480 Encounter for supervision of other normal pregnancy, unspecified trimester: Secondary | ICD-10-CM

## 2023-11-22 NOTE — Progress Notes (Signed)
 Pt presents for ROB visit. Pt c/o abd cramping. Want to defer AFP until next visit.

## 2023-11-22 NOTE — Progress Notes (Signed)
   PRENATAL VISIT NOTE  Subjective:  Teresa Johnson is a 28 y.o. G2P1001 at [redacted]w[redacted]d being seen today for ongoing prenatal care.  She is currently monitored for the following issues for this low-risk pregnancy and has Maternal varicella, non-immune; Supervision of other normal pregnancy, antepartum; and Cystic fibrosis carrier on their problem list.  Patient reports pelvic pain .  Contractions: Irritability. Vag. Bleeding: None.  Movement: Present. Denies leaking of fluid.   The following portions of the patient's history were reviewed and updated as appropriate: allergies, current medications, past family history, past medical history, past social history, past surgical history and problem list.   Objective:    Vitals:   11/22/23 1453  BP: (!) 91/50  Pulse: 86  Weight: 116 lb 6.4 oz (52.8 kg)    Fetal Status:  Fetal Heart Rate (bpm): 150   Movement: Present    General: Alert, oriented and cooperative. Patient is in no acute distress.  Skin: Skin is warm and dry. No rash noted.   Cardiovascular: Normal heart rate noted  Respiratory: Normal respiratory effort, no problems with respiration noted  Abdomen: Soft, gravid, appropriate for gestational age.  Pain/Pressure: Absent     Pelvic: Cervical exam deferred        Extremities: Normal range of motion.  Edema: None  Mental Status: Normal mood and affect. Normal behavior. Normal judgment and thought content.   Assessment and Plan:  Pregnancy: G2P1001 at [redacted]w[redacted]d 1. Supervision of other normal pregnancy, antepartum (Primary) BP and FHR normal Doing well   2. [redacted] weeks gestation of pregnancy Decline AFP today Discussed supportive measures for pelvic pain  Anatomy scan 7/28  3. Vaginal odor Hx BV,  swab collected today  Discussed bv prevention - Cervicovaginal ancillary only  4. Cystic fibrosis carrier Partner kit given today   Preterm labor symptoms and general obstetric precautions including but not limited to vaginal  bleeding, contractions, leaking of fluid and fetal movement were reviewed in detail with the patient. Please refer to After Visit Summary for other counseling recommendations.   Return in about 4 weeks (around 12/20/2023) for OB VISIT (MD or APP).  Future Appointments  Date Time Provider Department Center  12/18/2023  9:00 AM [facility] PROVIDER 1 [facility]  12/18/2023  9:30 AM [facility] US2 [facility]    Nidia Daring, FNP

## 2023-11-27 LAB — CERVICOVAGINAL ANCILLARY ONLY
Bacterial Vaginitis (gardnerella): NEGATIVE
Candida Glabrata: NEGATIVE
Candida Vaginitis: NEGATIVE
Comment: NEGATIVE
Comment: NEGATIVE
Comment: NEGATIVE

## 2023-11-28 ENCOUNTER — Ambulatory Visit: Payer: Self-pay | Admitting: Obstetrics and Gynecology

## 2023-12-13 ENCOUNTER — Other Ambulatory Visit: Payer: Self-pay

## 2023-12-13 MED ORDER — PRENATAL 28-0.8 MG PO TABS: 1.0000 | ORAL_TABLET | Freq: Every day | ORAL | 12 refills | Status: DC

## 2023-12-18 ENCOUNTER — Other Ambulatory Visit

## 2023-12-18 ENCOUNTER — Ambulatory Visit

## 2023-12-18 DIAGNOSIS — Z3480 Encounter for supervision of other normal pregnancy, unspecified trimester: Secondary | ICD-10-CM

## 2023-12-18 DIAGNOSIS — Z141 Cystic fibrosis carrier: Secondary | ICD-10-CM

## 2023-12-20 ENCOUNTER — Ambulatory Visit (INDEPENDENT_AMBULATORY_CARE_PROVIDER_SITE_OTHER): Admitting: Obstetrics and Gynecology

## 2023-12-20 VITALS — BP 96/62 | HR 97 | Wt 123.0 lb

## 2023-12-20 DIAGNOSIS — Z3A20 20 weeks gestation of pregnancy: Secondary | ICD-10-CM | POA: Diagnosis not present

## 2023-12-20 DIAGNOSIS — Z3482 Encounter for supervision of other normal pregnancy, second trimester: Secondary | ICD-10-CM

## 2023-12-20 DIAGNOSIS — Z3480 Encounter for supervision of other normal pregnancy, unspecified trimester: Secondary | ICD-10-CM

## 2023-12-20 MED ORDER — VITAFOL GUMMIES 3.33-0.333-34.8 MG PO CHEW: 3.0000 | CHEWABLE_TABLET | Freq: Every day | ORAL | 3 refills | Status: AC

## 2023-12-20 NOTE — Progress Notes (Signed)
   PRENATAL VISIT NOTE  Subjective:  Teresa Johnson is a 28 y.o. G2P1001 at [redacted]w[redacted]d being seen today for ongoing prenatal care.  She is currently monitored for the following issues for this low-risk pregnancy and has Maternal varicella, non-immune; Supervision of other normal pregnancy, antepartum; and Cystic fibrosis carrier on their problem list.  Patient doing well with no acute concerns today. She reports dry, chapped skin on breasts.  Contractions: Not present. Vag. Bleeding: None.  Movement: Present. Denies leaking of fluid.   The following portions of the patient's history were reviewed and updated as appropriate: allergies, current medications, past family history, past medical history, past social history, past surgical history and problem list. Problem list updated.  Objective:   Vitals:   12/20/23 1537  BP: 96/62  Pulse: 97  Weight: 123 lb (55.8 kg)    Fetal Status: Fetal Heart Rate (bpm): 141 (Simultaneous filing. User may not have seen previous data.) Fundal Height: 20 cm Movement: Present     General:  Alert, oriented and cooperative. Patient is in no acute distress.  Skin: Skin is warm and dry. No rash noted.   Cardiovascular: Normal heart rate noted  Respiratory: Normal respiratory effort, no problems with respiration noted  Abdomen: Soft, gravid, appropriate for gestational age.  Pain/Pressure: Absent     Pelvic: Cervical exam deferred        Extremities: Normal range of motion.  Edema: Trace  Mental Status:  Normal mood and affect. Normal behavior. Normal judgment and thought content.   Assessment and Plan:  Pregnancy: G2P1001 at [redacted]w[redacted]d  1. Supervision of other normal pregnancy, antepartum (Primary) Continue routine prenatal care  - AFP, Serum, Open Spina Bifida - Prenatal Vit-Fe Phos-FA-Omega (VITAFOL  GUMMIES) 3.33-0.333-34.8 MG CHEW; Chew 3 tablets by mouth daily.  Dispense: 90 tablet; Refill: 3  2. [redacted] weeks gestation of pregnancy  - AFP, Serum, Open Spina  Bifida  Preterm labor symptoms and general obstetric precautions including but not limited to vaginal bleeding, contractions, leaking of fluid and fetal movement were reviewed in detail with the patient.  Please refer to After Visit Summary for other counseling recommendations.   Return in about 4 weeks (around 01/17/2024) for ROB, in person.   Jerilynn Buddle, MD Faculty Attending Center for [facility]

## 2023-12-20 NOTE — Progress Notes (Signed)
 ROB, c/o breast pain and skin peeling

## 2023-12-22 LAB — AFP, SERUM, OPEN SPINA BIFIDA
AFP MoM: 0.81
AFP Value: 57.4 ng/mL
Gest. Age on Collection Date: 20 wk
Maternal Age At EDD: 26.8 a
OSBR Risk 1 IN: 10000
Test Results:: NEGATIVE
Weight: 123 [lb_av]

## 2023-12-25 ENCOUNTER — Ambulatory Visit: Payer: Self-pay | Admitting: Obstetrics and Gynecology

## 2023-12-25 DIAGNOSIS — Z3480 Encounter for supervision of other normal pregnancy, unspecified trimester: Secondary | ICD-10-CM

## 2024-01-17 ENCOUNTER — Encounter: Admitting: Obstetrics and Gynecology

## 2024-01-29 ENCOUNTER — Other Ambulatory Visit

## 2024-01-29 ENCOUNTER — Encounter: Admitting: Obstetrics

## 2024-01-29 ENCOUNTER — Ambulatory Visit

## 2024-01-30 ENCOUNTER — Encounter: Admitting: Obstetrics and Gynecology

## 2024-02-01 ENCOUNTER — Ambulatory Visit (HOSPITAL_COMMUNITY): Admission: EM | Admit: 2024-02-01 | Discharge: 2024-02-01 | Disposition: A | Discharge status: Home / Self Care

## 2024-02-01 ENCOUNTER — Encounter (HOSPITAL_COMMUNITY): Payer: Self-pay

## 2024-02-01 DIAGNOSIS — J069 Acute upper respiratory infection, unspecified: Secondary | ICD-10-CM

## 2024-02-01 LAB — POC COVID19/FLU A&B COMBO
Covid Antigen, POC: NEGATIVE
Influenza A Antigen, POC: NEGATIVE
Influenza B Antigen, POC: NEGATIVE

## 2024-02-01 MED ORDER — FLUTICASONE PROPIONATE 50 MCG/ACT NA SUSP: 1.0000 | Freq: Every day | NASAL | 2 refills | Status: DC

## 2024-02-01 NOTE — ED Triage Notes (Signed)
 Pt c/o cough and congestion x4 days. States hard to breath last night d/t stopped up nose. States she is a 6 months pregnant and has used nose spray only for sx's.

## 2024-02-01 NOTE — ED Provider Notes (Signed)
  PCP: Medicine, [facility] Family Chief Complaint: Cough  Subjective:   HPI: Patient is a 28 y.o. female here for cough that has been present since yesterday.  Patient is currently 6 months pregnant.  Her daughter has been had some viral respiratory symptoms for the past week and last night patient started with congestion and cough.  She denies any fevers although she has not been taking her temperature.  She denies any shortness of breath.  She feels like she has some chills.  She does have a sick contact at work, as well as her daughter being sick.  She denies any bleeding or vaginal discharge.  She endorses that her baby is still moving and normal.  Past Medical History:  Diagnosis Date   Asthma    seasonal allergy related    No current facility-administered medications on file prior to encounter.   Current Outpatient Medications on File Prior to Encounter  Medication Sig Dispense Refill   azelastine (ASTELIN) 0.1 % nasal spray      Blood Pressure Monitoring (BLOOD PRESSURE KIT) DEVI 1 Device by Does not apply route once a week. 1 each 0   calcium carbonate (TUMS - DOSED IN MG ELEMENTAL CALCIUM) 500 MG chewable tablet Chew 1 tablet by mouth daily.     cetirizine (ZYRTEC) 10 MG tablet Take 10 mg by mouth daily.     ondansetron  (ZOFRAN -ODT) 4 MG disintegrating tablet Take 1 tablet (4 mg total) by mouth every 8 (eight) hours as needed. 30 tablet 3   Prenatal Vit-Fe Phos-FA-Omega (VITAFOL  GUMMIES) 3.33-0.333-34.8 MG CHEW Chew 3 tablets by mouth daily. 90 tablet 3   promethazine  (PHENERGAN ) 25 MG tablet Take 1 tablet (25 mg total) by mouth every 6 (six) hours as needed for nausea or vomiting. May insert VAGINALLY, if unable to keep anything down by mouth. 30 tablet 3    BP 100/66 (BP Location: Right Arm)   Pulse 86   Temp 97.9 F (36.6 C) (Oral)   Resp 18   LMP 07/27/2023 (Approximate)   SpO2 98%        Objective:   Gen: Well developed, well nourished female in no  acute distress. HEENT: Pupils equal, round, and reactive to light.  Conjunctiva non-injected.  Nares patent without discharge.  Oral mucosa is moist and pink.  Posterior pharynx clear without erythema. CV: Regular rate and rhythm without murmurs, gallops, or rubs. Lungs: Clear to auscultation bilaterally with good effort MSK: Joints and muscles are symmetrical with no swelling, redness, or deformity. Ext: No cyanosis, clubbing, or edema.  Assessment/Plan:   Nica Friske is a 28 y.o. female who was seen today for the following: 1. Viral upper respiratory illness (Primary) 2. Acute upper respiratory infection - POC Covid19/Flu A&B Antigen; Standing - POC Covid19/Flu A&B Antigen -Testing for COVID and flu was negative -Patient can continue supportive measures and focus on hydration and Tylenol  -Patient is not a candidate for oral decongestions due to Brein pregnant and a short course of her symptoms -She can use Flonase  as needed   Follow-up/Education:   May return sooner as needed and encouraged to call/e-mail for additional questions or  worsening symptoms in the interim.  Krystal Lowing, DO Sports Medicine Fellow 02/01/2024 12:45 PM  Disclaimer: This transcription was electronically signed. It was transcribed by [place] and may contain errors in the text that were not recognized on proofreading.      Lowing Krystal HERO, DO 02/01/24 1245

## 2024-02-11 NOTE — Progress Notes (Unsigned)
   PRENATAL VISIT NOTE  Subjective:  Teresa Johnson is a 28 y.o. G2P1001 at [redacted]w[redacted]d being seen today for ongoing prenatal care.  She is currently monitored for the following issues for this {Blank single:19197::high-risk,low-risk} pregnancy and has Maternal varicella, non-immune; Supervision of other normal pregnancy, antepartum; and Cystic fibrosis carrier on their problem list.  Patient reports {sx:14538}.   .  .   . Denies leaking of fluid.   The following portions of the patient's history were reviewed and updated as appropriate: allergies, current medications, past family history, past medical history, past social history, past surgical history and problem list.   Objective:    There were no vitals filed for this visit.  Fetal Status:           General: Alert, oriented and cooperative. Patient is in no acute distress.  Skin: Skin is warm and dry. No rash noted.   Cardiovascular: Normal heart rate noted  Respiratory: Normal respiratory effort, no problems with respiration noted  Abdomen: Soft, gravid, appropriate for gestational age.        Pelvic: {Blank single:19197::Cervical exam performed in the presence of a chaperone,Cervical exam deferred}        Extremities: Normal range of motion.     Mental Status: Normal mood and affect. Normal behavior. Normal judgment and thought content.   Assessment and Plan:  Pregnancy: G2P1001 at [redacted]w[redacted]d  1. Supervision of other normal pregnancy, antepartum (Primary) ***  2. [redacted] weeks gestation of pregnancy Anticipatory guidance about next visits/weeks of pregnancy given.  Anatomy scan 03/06/24  3. Cystic fibrosis carrier Referred to genetic counseling  ***  {Blank single:19197::Term,Preterm} labor symptoms and general obstetric precautions including but not limited to vaginal bleeding, contractions, leaking of fluid and fetal movement were reviewed in detail with the patient. Please refer to After Visit Summary for other  counseling recommendations.   No follow-ups on file.  Future Appointments  Date Time Provider Department Center  02/12/2024  2:50 PM Delayza Lungren E, PA-C [facility] None  03/06/2024  1:00 PM [facility] PROVIDER 1 [facility]  03/06/2024  1:30 PM [facility] US1 [facility]    Jorene FORBES Moats, PA-C

## 2024-02-12 ENCOUNTER — Ambulatory Visit (INDEPENDENT_AMBULATORY_CARE_PROVIDER_SITE_OTHER): Admitting: Physician Assistant

## 2024-02-12 ENCOUNTER — Encounter: Payer: Self-pay | Admitting: Physician Assistant

## 2024-02-12 VITALS — BP 88/58 | HR 92 | Wt 129.0 lb

## 2024-02-12 DIAGNOSIS — Z141 Cystic fibrosis carrier: Secondary | ICD-10-CM

## 2024-02-12 DIAGNOSIS — Z3480 Encounter for supervision of other normal pregnancy, unspecified trimester: Secondary | ICD-10-CM

## 2024-02-12 DIAGNOSIS — Z3482 Encounter for supervision of other normal pregnancy, second trimester: Secondary | ICD-10-CM

## 2024-02-12 DIAGNOSIS — Z3A27 27 weeks gestation of pregnancy: Secondary | ICD-10-CM | POA: Diagnosis not present

## 2024-02-12 NOTE — Progress Notes (Signed)
 Pt presents for ROB visit. No concerns

## 2024-02-19 ENCOUNTER — Other Ambulatory Visit

## 2024-02-19 DIAGNOSIS — Z3A28 28 weeks gestation of pregnancy: Secondary | ICD-10-CM

## 2024-02-20 LAB — CBC
Hematocrit: 33.9 % — ABNORMAL LOW (ref 34.0–46.6)
Hemoglobin: 10.8 g/dL — ABNORMAL LOW (ref 11.1–15.9)
MCH: 30.9 pg (ref 26.6–33.0)
MCHC: 31.9 g/dL (ref 31.5–35.7)
MCV: 97 fL (ref 79–97)
Platelets: 224 x10E3/uL (ref 150–450)
RBC: 3.5 x10E6/uL — ABNORMAL LOW (ref 3.77–5.28)
RDW: 13 % (ref 11.7–15.4)
WBC: 9 x10E3/uL (ref 3.4–10.8)

## 2024-02-20 LAB — GLUCOSE TOLERANCE, 2 HOURS W/ 1HR
Glucose, 1 hour: 114 mg/dL (ref 70–179)
Glucose, 2 hour: 91 mg/dL (ref 70–152)
Glucose, Fasting: 79 mg/dL (ref 70–91)

## 2024-02-20 LAB — HIV ANTIBODY (ROUTINE TESTING W REFLEX): HIV Screen 4th Generation wRfx: NONREACTIVE

## 2024-02-20 LAB — RPR: RPR Ser Ql: NONREACTIVE

## 2024-02-28 ENCOUNTER — Encounter: Admitting: Obstetrics and Gynecology

## 2024-03-06 ENCOUNTER — Ambulatory Visit: Admitting: Obstetrics & Gynecology

## 2024-03-06 ENCOUNTER — Ambulatory Visit (HOSPITAL_BASED_OUTPATIENT_CLINIC_OR_DEPARTMENT_OTHER)

## 2024-03-06 ENCOUNTER — Other Ambulatory Visit: Payer: Self-pay | Admitting: Obstetrics and Gynecology

## 2024-03-06 ENCOUNTER — Other Ambulatory Visit: Payer: Self-pay | Admitting: *Deleted

## 2024-03-06 ENCOUNTER — Ambulatory Visit: Attending: Maternal & Fetal Medicine | Admitting: Maternal & Fetal Medicine

## 2024-03-06 VITALS — BP 97/57 | HR 86

## 2024-03-06 VITALS — BP 93/58 | HR 91 | Wt 130.0 lb

## 2024-03-06 DIAGNOSIS — Z3480 Encounter for supervision of other normal pregnancy, unspecified trimester: Secondary | ICD-10-CM

## 2024-03-06 DIAGNOSIS — O0933 Supervision of pregnancy with insufficient antenatal care, third trimester: Secondary | ICD-10-CM | POA: Diagnosis not present

## 2024-03-06 DIAGNOSIS — Z141 Cystic fibrosis carrier: Secondary | ICD-10-CM | POA: Insufficient documentation

## 2024-03-06 DIAGNOSIS — O926 Galactorrhea: Secondary | ICD-10-CM

## 2024-03-06 DIAGNOSIS — O358XX0 Maternal care for other (suspected) fetal abnormality and damage, not applicable or unspecified: Secondary | ICD-10-CM

## 2024-03-06 DIAGNOSIS — Z363 Encounter for antenatal screening for malformations: Secondary | ICD-10-CM | POA: Insufficient documentation

## 2024-03-06 DIAGNOSIS — Z3A31 31 weeks gestation of pregnancy: Secondary | ICD-10-CM | POA: Diagnosis not present

## 2024-03-06 DIAGNOSIS — O3680X0 Pregnancy with inconclusive fetal viability, not applicable or unspecified: Secondary | ICD-10-CM

## 2024-03-06 MED ORDER — NYSTATIN 100000 UNIT/GM EX CREA: 1.0000 | TOPICAL_CREAM | Freq: Two times a day (BID) | CUTANEOUS | 1 refills | Status: DC

## 2024-03-06 NOTE — Progress Notes (Signed)
 Patient information  Patient Name: Teresa Johnson  Patient MRN:   [id number]  Referring practice: [facility] Referring Provider: [facility]  Problem List   Patient Active Problem List   Diagnosis Date Noted   Late prenatal care affecting pregnancy in third trimester 03/06/2024   Cystic fibrosis carrier 11/07/2023   Supervision of other normal pregnancy, antepartum 10/04/2023   Maternal varicella, non-immune 02/04/2017   Maternal Fetal Medicine Consult Teresa Johnson is a 28 y.o. G2P1001 at [redacted]w[redacted]d here for ultrasound and consultation. She had low risk aneuploidy screening of a female fetus. Carrier screening was CF+. Maternal serum AFP was negative. She has no acute concerns.   Today we focused on the following:   Late care: Due to transpiration and work/scheduling conflicts. This limited our ability to see the fetal anatomy when the exam is this late in the pregnancy.   CF carrier: Pt states the FOB had testing but has not heard back results.   Sonographic findings Single intrauterine pregnancy at 31w 0d  Fetal cardiac activity:  Observed and appears normal. Presentation: Cephalic. The anatomic structures that were well seen appear normal without evidence of soft markers. Due to poor acoustic windows some structures remain suboptimally visualized. Fetal biometry shows the estimated fetal weight at the 26 percentile.  Amniotic fluid: Within normal limits.  MVP: 4.59 cm. Placenta: Anterior. Adnexa: No abnormality visualized.  Recommendations - OB provider to f/u on CF results of FOB - F/u in 4 weeks to assess the anatomy not seen.  Review of Systems: A review of systems was performed and was negative except per HPI   Past Obstetrical History:  OB History  Gravida Para Term Preterm AB Living  2 1 1  0 0 1  SAB IAB Ectopic Multiple Live Births  0 0 0 0 1    # Outcome Date GA Lbr Len/2nd Weight Sex Type Anes PTL Lv  2 Current           1 Term 07/03/17 [redacted]w[redacted]d 07:51 / 00:22  6 lb 1.5 oz (2.765 kg) F Vag-Spont EPI  LIV     Past Medical History:  Past Medical History:  Diagnosis Date   Asthma    seasonal allergy related     Past Surgical History:    Past Surgical History:  Procedure Laterality Date   NO PAST SURGERIES       Home Medications:   Current Outpatient Medications on File Prior to Visit  Medication Sig Dispense Refill   azelastine (ASTELIN) 0.1 % nasal spray      calcium carbonate (TUMS - DOSED IN MG ELEMENTAL CALCIUM) 500 MG chewable tablet Chew 1 tablet by mouth daily.     cetirizine (ZYRTEC) 10 MG tablet Take 10 mg by mouth daily.     fluticasone  (FLONASE ) 50 MCG/ACT nasal spray Place 1 spray into both nostrils daily. 9.9 mL 2   ondansetron  (ZOFRAN -ODT) 4 MG disintegrating tablet Take 1 tablet (4 mg total) by mouth every 8 (eight) hours as needed. 30 tablet 3   Prenatal Vit-Fe Phos-FA-Omega (VITAFOL  GUMMIES) 3.33-0.333-34.8 MG CHEW Chew 3 tablets by mouth daily. 90 tablet 3   promethazine  (PHENERGAN ) 25 MG tablet Take 1 tablet (25 mg total) by mouth every 6 (six) hours as needed for nausea or vomiting. May insert VAGINALLY, if unable to keep anything down by mouth. 30 tablet 3   Blood Pressure Monitoring (BLOOD PRESSURE KIT) DEVI 1 Device by Does not apply route once a week. 1 each 0  No current facility-administered medications on file prior to visit.      Allergies:   No Known Allergies   Physical Exam:   Vitals:   03/06/24 1320  BP: (!) 97/57  Pulse: 86   Sitting comfortably on the sonogram table Nonlabored breathing Normal rate and rhythm Abdomen is nontender  Thank you for the opportunity to be involved with this patient's care. Please let us  know if we can be of any further assistance.   30 minutes of time was spent reviewing the patient's chart including labs, imaging and documentation.  At least 50% of this time was spent with direct patient care discussing the diagnosis, management and prognosis of her care.  Delora Smaller MFM, [place]   03/06/2024  2:42 PM

## 2024-03-06 NOTE — Progress Notes (Signed)
   PRENATAL VISIT NOTE  Subjective:  Teresa Johnson is a 28 y.o. G2P1001 at [redacted]w[redacted]d being seen today for ongoing prenatal care.  She is currently monitored for the following issues for this low-risk pregnancy and has Maternal varicella, non-immune; Supervision of other normal pregnancy, antepartum; and Cystic fibrosis carrier on their problem list.  Patient reports left nipple discharge and irritation.  Contractions: Not present. Vag. Bleeding: None.  Movement: Present. Denies leaking of fluid.   The following portions of the patient's history were reviewed and updated as appropriate: allergies, current medications, past family history, past medical history, past social history, past surgical history and problem list.   Objective:    Vitals:   03/06/24 0849  BP: (!) 93/58  Pulse: 91  Weight: 130 lb (59 kg)    Fetal Status:  Fetal Heart Rate (bpm): 154   Movement: Present    General: Alert, oriented and cooperative. Patient is in no acute distress.  Skin: Skin is warm and dry. No rash noted. Irritation at left nipple possible yeast  Cardiovascular: Normal heart rate noted  Respiratory: Normal respiratory effort, no problems with respiration noted  Abdomen: Soft, gravid, appropriate for gestational age.  Pain/Pressure: Absent     Pelvic: Cervical exam deferred        Extremities: Normal range of motion.  Edema: Trace  Mental Status: Normal mood and affect. Normal behavior. Normal judgment and thought content.   Assessment and Plan:  Pregnancy: G2P1001 at [redacted]w[redacted]d 1. Supervision of other normal pregnancy, antepartum (Primary)   2. Nipple discharge in antepartum period Possible yeast  - nystatin cream (MYCOSTATIN); Apply 1 Application topically 2 (two) times daily.  Dispense: 30 g; Refill: 1  Preterm labor symptoms and general obstetric precautions including but not limited to vaginal bleeding, contractions, leaking of fluid and fetal movement were reviewed in detail with the  patient. Please refer to After Visit Summary for other counseling recommendations.   Return in about 2 weeks (around 03/20/2024).  Future Appointments  Date Time Provider Department Center  03/06/2024  1:00 PM [facility] PROVIDER 1 [facility]  03/06/2024  1:30 PM [facility] US1 [facility]    Lynwood Solomons, MD

## 2024-03-20 ENCOUNTER — Ambulatory Visit: Admitting: Obstetrics

## 2024-03-20 ENCOUNTER — Encounter: Payer: Self-pay | Admitting: Obstetrics

## 2024-03-20 VITALS — BP 107/60 | HR 94 | Wt 132.3 lb

## 2024-03-20 DIAGNOSIS — O0933 Supervision of pregnancy with insufficient antenatal care, third trimester: Secondary | ICD-10-CM | POA: Diagnosis not present

## 2024-03-20 DIAGNOSIS — Z141 Cystic fibrosis carrier: Secondary | ICD-10-CM | POA: Diagnosis not present

## 2024-03-20 DIAGNOSIS — Z2839 Other underimmunization status: Secondary | ICD-10-CM | POA: Diagnosis not present

## 2024-03-20 DIAGNOSIS — Z3480 Encounter for supervision of other normal pregnancy, unspecified trimester: Secondary | ICD-10-CM

## 2024-03-20 DIAGNOSIS — Z3490 Encounter for supervision of normal pregnancy, unspecified, unspecified trimester: Secondary | ICD-10-CM

## 2024-03-20 DIAGNOSIS — K219 Gastro-esophageal reflux disease without esophagitis: Secondary | ICD-10-CM | POA: Diagnosis not present

## 2024-03-20 DIAGNOSIS — Z3A33 33 weeks gestation of pregnancy: Secondary | ICD-10-CM

## 2024-03-20 MED ORDER — PANTOPRAZOLE SODIUM 40 MG PO TBEC: 40.0000 mg | DELAYED_RELEASE_TABLET | Freq: Two times a day (BID) | ORAL | 5 refills | Status: DC

## 2024-03-20 NOTE — Progress Notes (Signed)
 Pt presents for rob. Pt has no questions or concerns at this time.

## 2024-03-20 NOTE — Progress Notes (Signed)
 Subjective:  Teresa Johnson is a 28 y.o. G2P1001 at [redacted]w[redacted]d being seen today for ongoing prenatal care.  She is currently monitored for the following issues for this low-risk pregnancy and has Maternal varicella, non-immune; Supervision of other normal pregnancy, antepartum; Cystic fibrosis carrier; and Late prenatal care affecting pregnancy in third trimester on their problem list.  Patient reports heartburn.  Contractions: Irritability. Vag. Bleeding: None.  Movement: Present. Denies leaking of fluid.   The following portions of the patient's history were reviewed and updated as appropriate: allergies, current medications, past family history, past medical history, past social history, past surgical history and problem list. Problem list updated.  Objective:   Vitals:   03/20/24 1632 03/20/24 1635  BP: (!) 104/59 107/60  Pulse: 94 94  Weight: 132 lb 4.8 oz (60 kg)     Fetal Status: Fetal Heart Rate (bpm): 152   Movement: Present     General:  Alert, oriented and cooperative. Patient is in no acute distress.  Skin: Skin is warm and dry. No rash noted.   Cardiovascular: Normal heart rate noted  Respiratory: Normal respiratory effort, no problems with respiration noted  Abdomen: Soft, gravid, appropriate for gestational age. Pain/Pressure: Present     Pelvic:  Cervical exam deferred        Extremities: Normal range of motion.  Edema: None  Mental Status: Normal mood and affect. Normal behavior. Normal judgment and thought content.   Urinalysis:      Assessment and Plan:  Pregnancy: G2P1001 at [redacted]w[redacted]d  1. Supervision of other normal pregnancy, antepartum (Primary)  2. Late prenatal care affecting pregnancy in third trimester  3. Cystic fibrosis carrier  4. Maternal varicella, non-immune  5. GERD without esophagitis Rx: - pantoprazole (PROTONIX) 40 MG tablet; Take 1 tablet (40 mg total) by mouth 2 (two) times daily before a meal.  Dispense: 60 tablet; Refill: 5     Preterm  labor symptoms and general obstetric precautions including but not limited to vaginal bleeding, contractions, leaking of fluid and fetal movement were reviewed in detail with the patient. Please refer to After Visit Summary for other counseling recommendations.   No follow-ups on file.   Rudy Carlin LABOR, MD

## 2024-04-02 ENCOUNTER — Encounter: Admitting: Obstetrics and Gynecology

## 2024-04-03 ENCOUNTER — Ambulatory Visit: Attending: Obstetrics and Gynecology | Admitting: Obstetrics and Gynecology

## 2024-04-03 ENCOUNTER — Ambulatory Visit (HOSPITAL_BASED_OUTPATIENT_CLINIC_OR_DEPARTMENT_OTHER)

## 2024-04-03 VITALS — BP 99/57 | HR 91

## 2024-04-03 DIAGNOSIS — Z3A35 35 weeks gestation of pregnancy: Secondary | ICD-10-CM | POA: Diagnosis not present

## 2024-04-03 DIAGNOSIS — Z3483 Encounter for supervision of other normal pregnancy, third trimester: Secondary | ICD-10-CM

## 2024-04-03 DIAGNOSIS — Z148 Genetic carrier of other disease: Secondary | ICD-10-CM | POA: Diagnosis not present

## 2024-04-03 DIAGNOSIS — O09893 Supervision of other high risk pregnancies, third trimester: Secondary | ICD-10-CM | POA: Insufficient documentation

## 2024-04-03 DIAGNOSIS — O358XX0 Maternal care for other (suspected) fetal abnormality and damage, not applicable or unspecified: Secondary | ICD-10-CM | POA: Insufficient documentation

## 2024-04-03 DIAGNOSIS — O0933 Supervision of pregnancy with insufficient antenatal care, third trimester: Secondary | ICD-10-CM | POA: Diagnosis not present

## 2024-04-03 DIAGNOSIS — Z141 Cystic fibrosis carrier: Secondary | ICD-10-CM | POA: Insufficient documentation

## 2024-04-03 DIAGNOSIS — Z362 Encounter for other antenatal screening follow-up: Secondary | ICD-10-CM | POA: Diagnosis not present

## 2024-04-03 NOTE — Progress Notes (Signed)
 Maternal-Fetal Medicine Consultation  Name: Teresa Johnson  MRN: [id number]  GA: [id number] [redacted]w[redacted]d   Patient is here for fetal growth assessment.  She was accompanied by her partner.   She had fetal anatomical survey at [redacted] weeks gestation.  Patient does not have gestational diabetes. She is a carrier of cystic fibrosis mutation.  Patient reports her partner was screened but she is not aware of the results. Patient does not have gestational diabetes.  Blood pressure today at our office is 99/57 mmHg. Ultrasound Normal fetal growth and amniotic fluid.  Cephalic presentation.  We obtained her partner's results.  He is not a carrier of cystic fibrosis.  He is a carrier for alpha thalassemia (a-/a-).  I discussed carrier screening results.  I reassured the patient that the likelihood of having a fetus affected with cystic fibrosis is extremely low.  Since she is not a carrier of alpha thalassemia, the fetus is unlikely to be affected with alpha thalassemia.  Recommendations -No follow-up appointments were made.     Consultation including face-to-face (more than 50%) counseling 20 minutes.

## 2024-04-08 ENCOUNTER — Other Ambulatory Visit: Payer: Self-pay

## 2024-04-08 DIAGNOSIS — O926 Galactorrhea: Secondary | ICD-10-CM

## 2024-04-08 MED ORDER — NYSTATIN 100000 UNIT/GM EX CREA: 1.0000 | TOPICAL_CREAM | Freq: Two times a day (BID) | CUTANEOUS | 1 refills | Status: DC

## 2024-04-08 MED ORDER — TERCONAZOLE 0.8 % VA CREA: 1.0000 | TOPICAL_CREAM | Freq: Every day | VAGINAL | 0 refills | Status: DC

## 2024-04-11 ENCOUNTER — Encounter: Admitting: Obstetrics and Gynecology

## 2024-04-23 ENCOUNTER — Other Ambulatory Visit: Payer: Self-pay

## 2024-04-23 ENCOUNTER — Inpatient Hospital Stay (HOSPITAL_COMMUNITY)
Admission: AD | Admit: 2024-04-23 | Discharge: 2024-04-25 | DRG: 806 | Disposition: A | Attending: Family Medicine | Admitting: Family Medicine

## 2024-04-23 ENCOUNTER — Inpatient Hospital Stay (HOSPITAL_COMMUNITY): Admitting: Anesthesiology

## 2024-04-23 ENCOUNTER — Encounter (HOSPITAL_COMMUNITY): Payer: Self-pay | Admitting: Obstetrics and Gynecology

## 2024-04-23 DIAGNOSIS — Z141 Cystic fibrosis carrier: Secondary | ICD-10-CM

## 2024-04-23 DIAGNOSIS — O0933 Supervision of pregnancy with insufficient antenatal care, third trimester: Secondary | ICD-10-CM

## 2024-04-23 DIAGNOSIS — Z3A37 37 weeks gestation of pregnancy: Secondary | ICD-10-CM

## 2024-04-23 DIAGNOSIS — Z3480 Encounter for supervision of other normal pregnancy, unspecified trimester: Principal | ICD-10-CM

## 2024-04-23 DIAGNOSIS — O99820 Streptococcus B carrier state complicating pregnancy: Secondary | ICD-10-CM | POA: Diagnosis not present

## 2024-04-23 LAB — CBC
HCT: 30.8 % — ABNORMAL LOW (ref 36.0–46.0)
Hemoglobin: 10.3 g/dL — ABNORMAL LOW (ref 12.0–15.0)
MCH: 31 pg (ref 26.0–34.0)
MCHC: 33.4 g/dL (ref 30.0–36.0)
MCV: 92.8 fL (ref 80.0–100.0)
Platelets: 249 K/uL (ref 150–400)
RBC: 3.32 MIL/uL — ABNORMAL LOW (ref 3.87–5.11)
RDW: 13.4 % (ref 11.5–15.5)
WBC: 9 K/uL (ref 4.0–10.5)
nRBC: 0 % (ref 0.0–0.2)

## 2024-04-23 LAB — TYPE AND SCREEN
ABO/RH(D): AB POS
Antibody Screen: NEGATIVE

## 2024-04-23 LAB — GROUP B STREP BY PCR: Group B strep by PCR: POSITIVE — AB

## 2024-04-23 MED ORDER — BENZOCAINE-MENTHOL 20-0.5 % EX AERO: 1.0000 | INHALATION_SPRAY | CUTANEOUS | Status: DC | PRN

## 2024-04-23 MED ORDER — ACETAMINOPHEN 325 MG PO TABS: 650.0000 mg | ORAL_TABLET | ORAL | Status: DC | PRN

## 2024-04-23 MED ORDER — IBUPROFEN 600 MG PO TABS
600.0000 mg | ORAL_TABLET | Freq: Four times a day (QID) | ORAL | Status: DC
  Administered 2024-04-23 – 2024-04-25 (×7): 600 mg via ORAL
  Filled 2024-04-23 (×7): qty 1

## 2024-04-23 MED ORDER — LIDOCAINE HCL (PF) 1 % IJ SOLN
INTRAMUSCULAR | Status: DC | PRN
  Administered 2024-04-23 (×2): 4 mL via EPIDURAL

## 2024-04-23 MED ORDER — LIDOCAINE HCL (PF) 1 % IJ SOLN: 30.0000 mL | INTRAMUSCULAR | Status: DC | PRN

## 2024-04-23 MED ORDER — OXYCODONE-ACETAMINOPHEN 5-325 MG PO TABS: 2.0000 | ORAL_TABLET | ORAL | Status: DC | PRN

## 2024-04-23 MED ORDER — ONDANSETRON HCL 4 MG PO TABS: 4.0000 mg | ORAL_TABLET | ORAL | Status: DC | PRN

## 2024-04-23 MED ORDER — DIPHENHYDRAMINE HCL 50 MG/ML IJ SOLN: 12.5000 mg | INTRAMUSCULAR | Status: DC | PRN

## 2024-04-23 MED ORDER — EPHEDRINE 5 MG/ML INJ: 10.0000 mg | INTRAVENOUS | Status: DC | PRN

## 2024-04-23 MED ORDER — PRENATAL MULTIVITAMIN CH
1.0000 | ORAL_TABLET | Freq: Every day | ORAL | Status: DC
  Administered 2024-04-24 – 2024-04-25 (×2): 1 via ORAL
  Filled 2024-04-23 (×2): qty 1

## 2024-04-23 MED ORDER — ONDANSETRON HCL 4 MG/2ML IJ SOLN
4.0000 mg | Freq: Four times a day (QID) | INTRAMUSCULAR | Status: DC | PRN
  Administered 2024-04-23 (×2): 4 mg via INTRAVENOUS
  Filled 2024-04-23 (×2): qty 2

## 2024-04-23 MED ORDER — OXYTOCIN BOLUS FROM INFUSION
333.0000 mL | Freq: Once | INTRAVENOUS | Status: AC
  Administered 2024-04-23: 333 mL via INTRAVENOUS

## 2024-04-23 MED ORDER — LACTATED RINGERS IV SOLN: 500.0000 mL | INTRAVENOUS | Status: DC | PRN

## 2024-04-23 MED ORDER — TETANUS-DIPHTH-ACELL PERTUSSIS 5-2-15.5 LF-MCG/0.5 IM SUSP: 0.5000 mL | Freq: Once | INTRAMUSCULAR | Status: DC

## 2024-04-23 MED ORDER — LACTATED RINGERS IV SOLN: 500.0000 mL | Freq: Once | INTRAVENOUS | Status: DC

## 2024-04-23 MED ORDER — OXYTOCIN-SODIUM CHLORIDE 30-0.9 UT/500ML-% IV SOLN
2.5000 [IU]/h | INTRAVENOUS | Status: DC
  Filled 2024-04-23: qty 500

## 2024-04-23 MED ORDER — DIBUCAINE (PERIANAL) 1 % EX OINT: 1.0000 | TOPICAL_OINTMENT | CUTANEOUS | Status: DC | PRN

## 2024-04-23 MED ORDER — PHENYLEPHRINE 80 MCG/ML (10ML) SYRINGE FOR IV PUSH (FOR BLOOD PRESSURE SUPPORT): 80.0000 ug | PREFILLED_SYRINGE | INTRAVENOUS | Status: DC | PRN

## 2024-04-23 MED ORDER — SODIUM CHLORIDE 0.9% FLUSH
3.0000 mL | Freq: Two times a day (BID) | INTRAVENOUS | Status: DC
  Administered 2024-04-23: 3 mL via INTRAVENOUS

## 2024-04-23 MED ORDER — HYDROXYZINE HCL 50 MG PO TABS: 50.0000 mg | ORAL_TABLET | Freq: Four times a day (QID) | ORAL | Status: DC | PRN

## 2024-04-23 MED ORDER — ONDANSETRON HCL 4 MG/2ML IJ SOLN: 4.0000 mg | INTRAMUSCULAR | Status: DC | PRN

## 2024-04-23 MED ORDER — FENTANYL-BUPIVACAINE-NACL 0.5-0.125-0.9 MG/250ML-% EP SOLN
12.0000 mL/h | EPIDURAL | Status: DC | PRN
  Administered 2024-04-23: 12 mL/h via EPIDURAL
  Filled 2024-04-23: qty 250

## 2024-04-23 MED ORDER — SIMETHICONE 80 MG PO CHEW: 80.0000 mg | CHEWABLE_TABLET | ORAL | Status: DC | PRN

## 2024-04-23 MED ORDER — OXYCODONE-ACETAMINOPHEN 5-325 MG PO TABS: 1.0000 | ORAL_TABLET | ORAL | Status: DC | PRN

## 2024-04-23 MED ORDER — SODIUM CHLORIDE 0.9 % IV SOLN: 250.0000 mL | INTRAVENOUS | Status: DC | PRN

## 2024-04-23 MED ORDER — SENNOSIDES-DOCUSATE SODIUM 8.6-50 MG PO TABS
2.0000 | ORAL_TABLET | Freq: Every day | ORAL | Status: DC
  Administered 2024-04-24 – 2024-04-25 (×2): 2 via ORAL
  Filled 2024-04-23 (×2): qty 2

## 2024-04-23 MED ORDER — SODIUM CHLORIDE 0.9% FLUSH: 3.0000 mL | INTRAVENOUS | Status: DC | PRN

## 2024-04-23 MED ORDER — COCONUT OIL OIL
1.0000 | TOPICAL_OIL | Status: DC | PRN
  Administered 2024-04-25: 1 via TOPICAL

## 2024-04-23 MED ORDER — SOD CITRATE-CITRIC ACID 500-334 MG/5ML PO SOLN: 30.0000 mL | ORAL | Status: DC | PRN

## 2024-04-23 MED ORDER — ZOLPIDEM TARTRATE 5 MG PO TABS: 5.0000 mg | ORAL_TABLET | Freq: Every evening | ORAL | Status: DC | PRN

## 2024-04-23 MED ORDER — PENICILLIN G POT IN DEXTROSE 60000 UNIT/ML IV SOLN
3.0000 10*6.[IU] | INTRAVENOUS | Status: DC
  Administered 2024-04-23: 3 10*6.[IU] via INTRAVENOUS
  Filled 2024-04-23: qty 50

## 2024-04-23 MED ORDER — FENTANYL CITRATE (PF) 100 MCG/2ML IJ SOLN
50.0000 ug | INTRAMUSCULAR | Status: DC | PRN
  Administered 2024-04-23 (×2): 100 ug via INTRAVENOUS
  Filled 2024-04-23 (×2): qty 2

## 2024-04-23 MED ORDER — DIPHENHYDRAMINE HCL 25 MG PO CAPS: 25.0000 mg | ORAL_CAPSULE | Freq: Four times a day (QID) | ORAL | Status: DC | PRN

## 2024-04-23 MED ORDER — WITCH HAZEL-GLYCERIN EX PADS: 1.0000 | MEDICATED_PAD | CUTANEOUS | Status: DC | PRN

## 2024-04-23 MED ORDER — LACTATED RINGERS IV SOLN: INTRAVENOUS | Status: DC

## 2024-04-23 MED ORDER — SODIUM CHLORIDE 0.9 % IV SOLN
5.0000 10*6.[IU] | Freq: Once | INTRAVENOUS | Status: AC
  Administered 2024-04-23: 5 10*6.[IU] via INTRAVENOUS
  Filled 2024-04-23: qty 5

## 2024-04-23 NOTE — Anesthesia Preprocedure Evaluation (Signed)
 Anesthesia Evaluation  Patient identified by MRN, date of birth, ID band Patient awake    Reviewed: Allergy & Precautions, Patient's Chart, lab work & pertinent test results  History of Anesthesia Complications Negative for: history of anesthetic complications  Airway Mallampati: II  TM Distance: >3 FB Neck ROM: Full    Dental no notable dental hx.    Pulmonary asthma , Current Smoker   Pulmonary exam normal        Cardiovascular negative cardio ROS Normal cardiovascular exam     Neuro/Psych negative neurological ROS     GI/Hepatic negative GI ROS, Neg liver ROS,,,  Endo/Other  negative endocrine ROS    Renal/GU negative Renal ROS     Musculoskeletal negative musculoskeletal ROS (+)    Abdominal   Peds  Hematology  (+) Blood dyscrasia, anemia   Anesthesia Other Findings   Reproductive/Obstetrics (+) Pregnancy                              Anesthesia Physical Anesthesia Plan  ASA: 2  Anesthesia Plan: Epidural   Post-op Pain Management:    Induction:   PONV Risk Score and Plan: Treatment may vary due to age or medical condition  Airway Management Planned: Natural Airway  Additional Equipment: Fetal Monitoring  Intra-op Plan:   Post-operative Plan:   Informed Consent: I have reviewed the patients History and Physical, chart, labs and discussed the procedure including the risks, benefits and alternatives for the proposed anesthesia with the patient or authorized representative who has indicated his/her understanding and acceptance.       Plan Discussed with:   Anesthesia Plan Comments:          Anesthesia Quick Evaluation

## 2024-04-23 NOTE — Anesthesia Procedure Notes (Signed)
 Epidural Patient location during procedure: OB Start time: 04/23/2024 5:48 PM End time: 04/23/2024 5:51 PM  Staffing Anesthesiologist: Paul Lamarr BRAVO, MD Performed: anesthesiologist   Preanesthetic Checklist Completed: patient identified, IV checked, risks and benefits discussed, monitors and equipment checked, pre-op evaluation and timeout performed  Epidural Patient position: sitting Prep: DuraPrep and site prepped and draped Patient monitoring: continuous pulse ox, blood pressure and heart rate Approach: midline Location: L3-L4 Injection technique: LOR air  Needle:  Needle type: Tuohy  Needle gauge: 17 G Needle length: 9 cm Needle insertion depth: 4 cm Catheter type: closed end flexible Catheter size: 19 Gauge Catheter at skin depth: 9 cm Test dose: negative and Other (1% lidocaine )  Assessment Events: blood not aspirated, no cerebrospinal fluid, injection not painful, no injection resistance, no paresthesia and negative IV test  Additional Notes Patient identified. Risks, benefits, and alternatives discussed with patient including but not limited to bleeding, infection, nerve damage, paralysis, failed block, incomplete pain control, headache, blood pressure changes, nausea, vomiting, reactions to medication, itching, and postpartum back pain. Confirmed with bedside nurse the patient's most recent platelet count. Confirmed with patient that they are not currently taking any anticoagulation, have any bleeding history, or any family history of bleeding disorders. Patient expressed understanding and wished to proceed. All questions were answered. Sterile technique was used throughout the entire procedure. Please see nursing notes for vital signs.   Crisp LOR on first pass. Test dose was given through epidural catheter and negative prior to continuing to dose epidural or start infusion. Warning signs of high block given to the patient including shortness of breath,  tingling/numbness in hands, complete motor block, or any concerning symptoms with instructions to call for help. Patient was given instructions on fall risk and not to get out of bed. All questions and concerns addressed with instructions to call with any issues or inadequate analgesia.  Reason for block:procedure for pain

## 2024-04-23 NOTE — Progress Notes (Signed)
 Comfortable after epidural, cat 1 tracing, 9 cm dilated, risks/benefits of arom discussed including risk of cord prolapse, patient assents to arom (clear), expectant mgmt

## 2024-04-23 NOTE — H&P (Signed)
 OBSTETRIC ADMISSION HISTORY AND PHYSICAL  Teresa Johnson is a 28 y.o. female G2P1001 with IUP at [redacted]w[redacted]d by 9w US  presenting for labor. She reports +FMs, No LOF, no VB, no blurry vision, headaches or peripheral edema, and RUQ pain.  She plans on breast feeding. She request pills for birth control. 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  Past Medical History: Past Medical History:  Diagnosis Date   Asthma    seasonal allergy related    Past Surgical History: Past Surgical History:  Procedure Laterality Date   NO PAST SURGERIES      Obstetrical History: OB History     Gravida  2   Para  1   Term  1   Preterm  0   AB  0   Living  1      SAB  0   IAB  0   Ectopic  0   Multiple  0   Live Births  1           Social History Social History   Socioeconomic History   Marital status: Single    Spouse name: Not on file   Number of children: Not on file   Years of education: Not on file   Highest education level: Not on file  Occupational History   Not on file  Tobacco Use   Smoking status: Every Day    Types: Cigars   Smokeless tobacco: Never   Tobacco comments:    Black and mild  Vaping Use   Vaping status: Former   Substances: Nicotine, THC  Substance and Sexual Activity   Alcohol use: No   Drug use: Not Currently    Types: Marijuana    Comment: 2 weeks ago   Sexual activity: Yes    Partners: Male    Birth control/protection: None  Other Topics Concern   Not on file  Social History Narrative   Not on file   Social Drivers of Health   Financial Resource Strain: Low Risk  (01/02/2024)   Received from [facility]   Overall Financial Resource Strain (CARDIA)    How hard is it for you to pay for the very basics like  food, housing, medical care, and heating?: Not very hard  Food Insecurity: Patient Declined (01/02/2024)   Received from [facility]   Hunger Vital Sign    Within the past 12 months, you worried that your food would run out before you got the money to buy more.: Patient declined    Within the past 12 months, the food you bought just didn't last and you didn't have money to get more.: Patient declined  Transportation Needs: Unmet Transportation Needs (01/02/2024)   Received from [facility] - Transportation    In the past 12 months, has lack of transportation kept you from medical appointments or from getting medications?: Yes    In the past 12 months, has lack of transportation kept you from meetings, work, or from getting things needed for daily living?: Yes  Physical Activity: Sufficiently Active (01/02/2024)   Received from [facility]   Exercise Vital Sign    On average, how many days per week do you engage in moderate to strenuous exercise (like a brisk walk)?: 5  days    On average, how many minutes do you engage in exercise at this level?: 60 min  Stress: No Stress Concern Present (01/02/2024)   Received from [facility] - Occupational Stress Questionnaire    Do you feel stress - tense, restless, nervous, or anxious, or unable to sleep at night because your mind is troubled all the time - these days?: Only a little  Social Connections: Socially Integrated (01/02/2024)   Received from [facility]   Social Network    How would you rate your social network (family, work, friends)?: Good participation with social networks    Family History: Family History  Problem Relation Age of Onset   Hypertension Mother    Diabetes Mother    Seizures Father     Allergies: No Known Allergies  Medications Prior to Admission  Medication Sig Dispense Refill Last Dose/Taking   calcium carbonate (TUMS - DOSED IN MG ELEMENTAL CALCIUM) 500  MG chewable tablet Chew 1 tablet by mouth daily.   Past Week   nystatin  cream (MYCOSTATIN ) Apply 1 Application topically 2 (two) times daily. 30 g 1 04/22/2024   Prenatal Vit-Fe Phos-FA-Omega (VITAFOL  GUMMIES) 3.33-0.333-34.8 MG CHEW Chew 3 tablets by mouth daily. 90 tablet 3 04/22/2024   promethazine  (PHENERGAN ) 25 MG tablet Take 1 tablet (25 mg total) by mouth every 6 (six) hours as needed for nausea or vomiting. May insert VAGINALLY, if unable to keep anything down by mouth. 30 tablet 3 Past Week   azelastine (ASTELIN) 0.1 % nasal spray       Blood Pressure Monitoring (BLOOD PRESSURE KIT) DEVI 1 Device by Does not apply route once a week. 1 each 0    cetirizine (ZYRTEC) 10 MG tablet Take 10 mg by mouth daily.      fluticasone  (FLONASE ) 50 MCG/ACT nasal spray Place 1 spray into both nostrils daily. 9.9 mL 2    ondansetron  (ZOFRAN -ODT) 4 MG disintegrating tablet Take 1 tablet (4 mg total) by mouth every 8 (eight) hours as needed. 30 tablet 3 04/21/2024   pantoprazole  (PROTONIX ) 40 MG tablet Take 1 tablet (40 mg total) by mouth 2 (two) times daily before a meal. 60 tablet 5 04/21/2024   terconazole  (TERAZOL 3 ) 0.8 % vaginal cream Place 1 applicator vaginally at bedtime. Apply nightly for three nights. 20 g 0      Review of Systems   All systems reviewed and negative except as stated in HPI  Blood pressure 99/60, pulse 88, temperature 98.6 F (37 C), temperature source Oral, resp. rate 18, height 5' 1 (1.549 m), weight 59.7 kg, last menstrual period 07/27/2023, SpO2 100%, unknown if currently breastfeeding. General appearance: alert, cooperative, and appears stated age Lungs: clear to auscultation bilaterally Heart: regular rate and rhythm Abdomen: soft, non-tender; bowel sounds normal Extremities: Homans sign is negative, no sign of DVT Presentation: cephalic per rn exam Fetal monitoringBaseline: 135 bpm, Variability: Good {> 6 bpm), Accelerations: Reactive, and Decelerations: Early Uterine  activityFrequency: Every 5-6 minutes Dilation: 5.5 Effacement (%): 80 Station: -3 Exam by:: MM RN   Prenatal labs: ABO, Rh: --/--/PENDING (12/02 1124) Antibody: PENDING (12/02 1124) Rubella: 1.68 (06/04 1030) RPR: Non Reactive (09/29 0933)  HBsAg: Negative (06/04 1030)  HIV: Non Reactive (09/29 0933)  GBS:      Lab Results  Component Value Date   GBS Negative 06/14/2017   GTT normal 2 hr Genetic screening  CF carrier Anatomy US  low-risk  Immunization History  Administered  Date(s) Administered   Influenza,inj,Quad PF,6+ Mos 01/25/2017   PFIZER(Purple Top)SARS-COV-2 Vaccination 02/07/2020, 03/11/2020   Tdap 05/03/2017    Prenatal Transfer Tool  Maternal Diabetes: No Genetic Screening: CF carrier Maternal Ultrasounds/Referrals: Normal Fetal Ultrasounds or other Referrals:  None Maternal Substance Abuse:  No Significant Maternal Medications:  None Significant Maternal Lab Results: GBS unknown Number of Prenatal Visits:greater than 3 verified prenatal visits Maternal Vaccinations:Covid Other Comments:  None   Results for orders placed or performed during the hospital encounter of 04/23/24 (from the past 24 hours)  Type and screen [facility]   Collection Time: 04/23/24 11:24 AM  Result Value Ref Range   ABO/RH(D) PENDING    Antibody Screen PENDING    Sample Expiration      04/26/2024,2359 Performed at [facility], 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 G2P1001 at [redacted]w[redacted]d here for SOL. Making slow cervical change.  #Labor: expectant #Pain: IV for now #FWB: Category I with early decels #GBS  status:  Unknown, pcr pending, no indication for treatment at this time #Feeding: breast/bottle #Reproductive Life planning: Nexplanon, outpt #Circ:  yes, inpt  Devaughn KATHEE Ban, MD  04/23/2024, 11:52 AM

## 2024-04-23 NOTE — Progress Notes (Signed)
 Reviewed NST/EFM  Patient with irregular contractions  FHR 140s/moderate/+accels, decels to 120 that coincide with contractions and appear like early decels morphologically. Once ctx were picked up it was clear this minor and shallow decreases started with the contraction and resolved within 10 seconds of the contraction ending.   Reactive and reassuring EFM  Teresa Johnson

## 2024-04-23 NOTE — Discharge Summary (Signed)
 Postpartum Discharge Summary  Date of Service updated***     Patient Name: Teresa Johnson DOB: [birth year] MRN: [id number]  Date of admission: 04/23/2024 Delivery date:04/23/2024 Delivering provider: JOMARIE CAMPI A Date of discharge: 04/23/2024  Admitting diagnosis: Normal labor [O80, Z37.9] Intrauterine pregnancy: [redacted]w[redacted]d     Secondary diagnosis:  Principal Problem:   Normal labor  Additional problems: ***    Discharge diagnosis: Term Pregnancy Delivered                                              Post partum procedures:None Augmentation: AROM Complications: None  Hospital course: Onset of Labor With Vaginal Delivery      28 y.o. yo G2P1001 at [redacted]w[redacted]d was admitted in Latent Labor on 04/23/2024. Labor course was uncomplicated  Membrane Rupture Time/Date: 6:21 PM,04/23/2024  Delivery Method:Vaginal, Spontaneous Operative Delivery:N/A Episiotomy: None Lacerations:    Patient had a postpartum course complicated by ***.  She is ambulating, tolerating a regular diet, passing flatus, and urinating well. Patient is discharged home in stable condition on 04/23/24.  Newborn Data: Birth date:04/23/2024 Birth time:7:25 PM Gender:Female Living status:Living Apgars:9 ,9  Weight:   Magnesium Sulfate received: No BMZ received: No Rhophylac:N/A MMR:N/A T-DaP:{Tdap:23962}offer PP Flu: No RSV Vaccine received: No Transfusion:No  Immunizations received: Immunization History  Administered Date(s) Administered   Influenza,inj,Quad PF,6+ Mos 01/25/2017   PFIZER(Purple Top)SARS-COV-2 Vaccination 02/07/2020, 03/11/2020   Tdap 05/03/2017    Physical exam  Vitals:   04/23/24 1826 04/23/24 1831 04/23/24 1859 04/23/24 1901  BP: (!) 93/57 99/71  (!) 101/55  Pulse: 78 84  74  Resp:   18   Temp:  97.8 F (36.6 C)    TempSrc:  Oral    SpO2:      Weight:      Height:       General: {Exam; general:21111117} Lochia: {Desc; appropriate/inappropriate:30686::appropriate} Uterine Fundus:  {Desc; firm/soft:30687} Incision: {Exam; incision:21111123} DVT Evaluation: {Exam; dvt:2111122} Labs: Lab Results  Component Value Date   WBC 9.0 04/23/2024   HGB 10.3 (L) 04/23/2024   HCT 30.8 (L) 04/23/2024   MCV 92.8 04/23/2024   PLT 249 04/23/2024      Latest Ref Rng & Units 04/19/2017   11:03 AM  CMP  Glucose 65 - 99 mg/dL 75    Edinburgh Score:    08/04/2017    9:22 AM  Edinburgh Postnatal Depression Scale Screening Tool  I have been able to laugh and see the funny side of things. 0   I have looked forward with enjoyment to things. 0   I have blamed myself unnecessarily when things went wrong. 1   I have been anxious or worried for no good reason. 0   I have felt scared or panicky for no good reason. 0   Things have been getting on top of me. 1   I have been so unhappy that I have had difficulty sleeping. 1   I have felt sad or miserable. 0   I have been so unhappy that I have been crying. 0   The thought of harming myself has occurred to me. 0   Edinburgh Postnatal Depression Scale Total 3      Data saved with a previous flowsheet row definition   No data recorded  After visit meds:  Allergies as of 04/23/2024   No Known Allergies   Med Rec must  be completed prior to using this [facility]***        Discharge home in stable condition Infant Feeding: Breast Infant Disposition:home with mother Discharge instruction: per After Visit Summary and Postpartum booklet. Activity: Advance as tolerated. Pelvic rest for 6 weeks.  Diet: routine diet Future Appointments: Future Appointments  Date Time Provider Department Center  04/25/2024  3:50 PM Rudy Carlin LABOR, MD [facility] None   Follow up Visit:  Message sent to [facility] 12/2  Please schedule this patient for a In person postpartum visit in 6 weeks with the following provider: Any provider. Additional Postpartum F/U:None Low risk pregnancy Delivery mode:  Vaginal, Spontaneous Anticipated Birth Control:   Pills   04/23/2024 Charlie LABOR Courts, MD

## 2024-04-23 NOTE — MAU Note (Signed)
 Teresa Johnson is a 28 y.o. at [redacted]w[redacted]d here in MAU reporting: she's having ctxs that are every 15-30 minutes, reports ctxs began the day before yesterday.  Denies VB and LOF.  Endorses +FM.  LMP: 07/27/2023 Onset of complaint: 2 days ago Pain score: 9 Vitals:   04/23/24 0810  BP: 99/60  Pulse: 88  Resp: 18  Temp: 98.6 F (37 C)  SpO2: 100%     FHT: 147 bpm  Lab orders placed from triage: None

## 2024-04-24 LAB — CBC
HCT: 27.2 % — ABNORMAL LOW (ref 36.0–46.0)
Hemoglobin: 9.3 g/dL — ABNORMAL LOW (ref 12.0–15.0)
MCH: 31.2 pg (ref 26.0–34.0)
MCHC: 34.2 g/dL (ref 30.0–36.0)
MCV: 91.3 fL (ref 80.0–100.0)
Platelets: 227 K/uL (ref 150–400)
RBC: 2.98 MIL/uL — ABNORMAL LOW (ref 3.87–5.11)
RDW: 13.3 % (ref 11.5–15.5)
WBC: 12.5 K/uL — ABNORMAL HIGH (ref 4.0–10.5)
nRBC: 0 % (ref 0.0–0.2)

## 2024-04-24 LAB — SYPHILIS: RPR W/REFLEX TO RPR TITER AND TREPONEMAL ANTIBODIES, TRADITIONAL SCREENING AND DIAGNOSIS ALGORITHM: RPR Ser Ql: NONREACTIVE

## 2024-04-24 MED ORDER — FERROUS SULFATE 325 (65 FE) MG PO TABS
325.0000 mg | ORAL_TABLET | ORAL | Status: DC
  Administered 2024-04-24: 325 mg via ORAL
  Filled 2024-04-24: qty 1

## 2024-04-24 MED ORDER — NYSTATIN 100000 UNIT/GM EX CREA
TOPICAL_CREAM | Freq: Two times a day (BID) | CUTANEOUS | Status: DC
  Filled 2024-04-24: qty 30

## 2024-04-24 NOTE — Anesthesia Postprocedure Evaluation (Signed)
 Anesthesia Post Note  Patient: Teresa Johnson  Procedure(s) Performed: AN AD HOC LABOR EPIDURAL     Patient location during evaluation: Mother Baby Anesthesia Type: Epidural Level of consciousness: awake and alert Pain management: pain level controlled Vital Signs Assessment: post-procedure vital signs reviewed and stable Respiratory status: spontaneous breathing, nonlabored ventilation and respiratory function stable Cardiovascular status: stable Postop Assessment: no headache, no backache and epidural receding Anesthetic complications: no   No notable events documented.  Last Vitals:  Vitals:   04/23/24 2338 04/24/24 0337  BP: 98/71 95/64  Pulse: 89 68  Resp: 18 18  Temp: 36.7 C 36.7 C  SpO2: 100% 99%    Last Pain:  Vitals:   04/24/24 0556  TempSrc:   PainSc: 0-No pain   Pain Goal:                   [name]

## 2024-04-24 NOTE — Progress Notes (Signed)
 POSTPARTUM PROGRESS NOTE  Post Partum Day 1  Subjective:  Teresa Johnson is a 28 y.o. [id number] s/p SVD at [redacted]w[redacted]d.  She reports she is doing well. No acute events overnight. She denies any problems with ambulating, voiding or po intake. Denies nausea or vomiting.  Pain is well controlled.  Lochia is Normal.  Objective: Blood pressure 107/73, pulse 73, temperature 98.3 F (36.8 C), temperature source Oral, resp. rate 18, height 5' 1 (1.549 m), weight 59.7 kg, last menstrual period 07/27/2023, SpO2 100%, unknown if currently breastfeeding.  BP Readings from Last 3 Encounters:  04/24/24 107/73  04/03/24 (!) 99/57  03/20/24 107/60    Physical Exam:  General: alert, cooperative and no distress Chest: no respiratory distress Breast: scaling rash on left breast Heart:regular rate Uterine Fundus: firm, appropriately tender DVT Evaluation: No calf swelling or tenderness Extremities: no edema Skin: warm, dry  Recent Labs    04/23/24 1125 04/24/24 0449  HGB 10.3* 9.3*  HCT 30.8* 27.2*    Assessment/Plan: Teresa Johnson is a 28 y.o. [id number] s/p NSVD at [redacted]w[redacted]d   PPD# 1 - Doing well  Routine postpartum care  Delivery Complications: none Blood Pressure: normal Anemia/Hb Status: Low, start PO ferrous sulfate. Started on oral iron therapy for asymptomatic but clinically significant postpartum acute blood loss anemia. Contraception: Outpatient Nexplanon Feeding: breast feeding  Dispo: Plan for discharge tomorrow--late PM delivery.   LOS: 1 day   Leeroy KATHEE Pouch, MD OB Fellow  04/24/2024, 12:02 PM

## 2024-04-24 NOTE — Lactation Note (Signed)
 This note was copied from a baby's chart. Lactation Consultation Note  Patient Name: Teresa Johnson Unijb'd Date: 04/24/2024 Age:26 hours Reason for consult: Initial assessment;MD order;Early term 37-38.6wks;RN request;Nipple pain/trauma  P2- RN requested for LC to assess [name]'s breast because she has a rash and the MD wanted to prescribe her nystatin  cream to rub on it. LC was told that the rash may be a yeast infection. LC assessed [name]'s breasts and noted a rash on the left breast only. The rash was a little bigger than a golf ball size and located on one area of the areola only. The rash thick, scaly and dark in color. [name] reports that the rash is severely itchy, burns and sometimes pusses out of her old piercing. [name] reports using different creams and soaps to help it. [name] reports that nothing has helped this rash for the past 3-4 months that she has had it. Due to the characteristics of this rash and how long [name] has had it, LC does not believe this is a yeast infection. This rash is consistent with psoriasis. LC reviewed how to use the nystatin  cream that was prescribed and placed the first dose. LC informed [name] that this medication may not help if it is not a yeast infection. LC team will check on the rash again tomorrow to check it's progression. LC encouraged [name] to call for further assistance as needed.  Maternal Data Has patient been taught Hand Expression?: No Does the patient have breastfeeding experience prior to this delivery?: Yes How long did the patient breastfeed?: 5 months  Feeding Mother's Current Feeding Choice: Breast Milk and Formula Nipple Type: Extra Slow Flow  Lactation Tools Discussed/Used Tools: Pump;Flanges Flange Size: 18 Breast pump type: Manual Pump Education: Setup, frequency, and cleaning;Milk Storage Reason for Pumping: [name] has no pump Pumping frequency: 15-20 min every 3 hrs  Interventions Interventions: Breast feeding basics reviewed;Education;LC  Services brochure  Discharge Discharge Education: Engorgement and breast care;Warning signs for feeding baby Pump: Manual;Referral sent for [facility] Pump  Consult Status Consult Status: Follow-up Date: 04/25/24 Follow-up type: In-patient    Recardo Hoit BS, IBCLC 04/24/2024, 4:35 PM

## 2024-04-25 ENCOUNTER — Encounter: Admitting: Obstetrics

## 2024-04-25 MED ORDER — IBUPROFEN 600 MG PO TABS: 600.0000 mg | ORAL_TABLET | Freq: Four times a day (QID) | ORAL | 0 refills | Status: AC | PRN

## 2024-04-25 NOTE — Patient Instructions (Signed)

## 2024-04-25 NOTE — Social Work (Signed)
CSW acknowledged consult and completed a clinical assessment.  There are no barriers to d/c.  Clinical assessment notes will be entered at a later time.  Tymar Polyak, LCSWA Clinical Social Worker [phone]  

## 2024-04-25 NOTE — Lactation Note (Signed)
 This note was copied from a baby's chart. Lactation Consultation Note  Patient Name: Teresa Johnson Unijb'd Date: 04/25/2024 Age:25 hours Reason for consult: Follow-up assessment;Infant weight loss;Early term 37-38.6wks (8 % weight loss, post circ) Per mom the baby has been sleepy since the circ and hasn't fed since  1354 25 mins at the breast.  LC recommended since they are getting ready to go home try hourly until he feeds well at home.  Mom received her Stork DEBP. And has a hand pump .  LC reviewed breast feeding D/C teaching and the [facility] resources.  LC reviewed supply and demand , importance to enhance the weight gain to offer the breast with cues and by 3 hours. Feed STS 1st breast for 20 mins ( 30 mins max , supplement according to the Peds and post pump . The next feeding switched to the other breast and do the same.  Mom has been using the Nystatin  the doctor ordered for her rash on the left breast and LC had a difficult identifying it due to it being coated with the Nystatin  cream. Per mom the rash is so much better and 1/2 the size and not dry or itchy anymore.   Maternal Data Does the patient have breastfeeding experience prior to this delivery?: Yes  Feeding Mother's Current Feeding Choice: Breast Milk and Formula  LATCH Score - did not latch at the D/C [facility] consult    Lactation Tools Discussed/Used Tools: Pump Flange Size: 18 Breast pump type: Manual Pump Education: Setup, frequency, and cleaning;Milk Storage  Interventions Education    Discharge Discharge Education: Engorgement and breast care;Warning signs for feeding baby;Outpatient recommendation;Other (comment) (already scheduled- see progress notes) Pump: Manual;Personal;Received Stork Pump  Consult Status Consult Status: Complete Date: 04/25/24    Rollene Caldron [name] 04/25/2024, 5:30 PM

## 2024-04-25 NOTE — Clinical Social Work Maternal (Addendum)
 CLINICAL SOCIAL WORK MATERNAL/CHILD NOTE  Patient Details  Name: Teresa Johnson MRN: [id number] Date of Birth: 1995-10-05  Date:  04/25/2024  Clinical Social Worker Initiating Note:  Eliazar Mikaiah Stoffer Date/Time: Initiated:  04/24/24/      Child's Name:  Teresa Johnson 04/23/2024   Biological Parents:  Mother, Father Daune Divirgilio 18[birth year]Apr-1998, Teresa Johnson [birth year]/1997)   Need for Interpreter:  None   Reason for Referral:  Current Substance Use/Substance Use During Pregnancy     Address:  [address] [place] [address]    Phone number:  [phone] (home)     Additional phone number:   Household Members/Support Persons (HM/SP):   Household Member/Support Person 1   HM/SP Name Relationship DOB or Age  HM/SP -1 Teresa Johnson FOB 05[birth year]1997  HM/SP -2        HM/SP -3        HM/SP -4        HM/SP -5        HM/SP -6        HM/SP -7        HM/SP -8          Natural Supports (not living in the home):  Parent   Professional Supports: None   Employment: Full-time   Type of Work: Engineer, [employer]   Education:  [employer] school graduate   Homebound arranged:    Surveyor, Quantity Resources:  Medicaid   Other Resources:  [facility]   Cultural/Religious Considerations Which May Impact Care:    Strengths:  Home prepared for child  , Ability to meet basic needs  , Pediatrician chosen   Psychotropic Medications:         Pediatrician:    [place]  Pediatrician List:   [facility] [place]    [facility]  [facility]      Pediatrician Fax Number:    Risk Factors/Current Problems:  None   Cognitive State:  Able to Concentrate  , Alert     Mood/Affect:  Calm  , Comfortable     CSW Assessment: CSW received a consult for THC use during pregnancy, CSW met with [name] to complete assessment and offer support. CSW entered the room and observed [name] resting in bed and the infant in the bassinet. CSW  introduced self, CSW role and reason for visit, [name] was agreeable to visit.  CSW inquired about how [name] was feeling, [name]  reported good.  CSW confirmed [name]'s address and phone number, [name] reported her address is [address] [place], [place]. CSW inquired about [name] MH hx, [name] denied MH concerns. CSW provided education regarding the baby blues period vs. perinatal mood disorders, discussed treatment and gave resources for mental health follow up if concerns arise.  CSW recommends self-evaluation during the postpartum time period using the New Mom Checklist from Postpartum Progress and encouraged [name] to contact a medical professional if symptoms are noted at any time.  [name] identified her sister and mom a her primary supports.   CSW inquired about not THC use, [name] reported her last use was about 2 weeks ago, CSW explained the hospital drug screen policy, [name] verbalized understanding, CSW informed [name] infants UDS was positive for THC and the CDS was pending, MBO verbalized understanding.   CSW provided review of Sudden Infant Death Syndrome (SIDS) precautions.  [name] reported she all essential items for the infant including a bassinet and car seat.  CSW identifies no further need for intervention and no barriers to discharge at this time.   CSW Plan/Description:  No Further Intervention Required/No Barriers to Discharge, Sudden Infant Death Syndrome (SIDS) Education, Perinatal Mood and Anxiety Disorder (PMADs) Education, CSW Will Continue to Monitor Umbilical Cord Tissue Drug Screen Results and Make Report if Warranted, Child Protective Service Report  , Hospital Drug Screen Policy Information    Eliazar CHRISTELLA Gave, [place] 04/25/2024, 2:39 PM

## 2024-05-01 ENCOUNTER — Telehealth (HOSPITAL_COMMUNITY): Payer: Self-pay | Admitting: *Deleted

## 2024-05-01 NOTE — Telephone Encounter (Signed)
 05/01/2024  Name: Teresa Johnson MRN: [id number] DOB: 20-Aug-1995  Reason for Call:  Transition of Care Hospital Discharge Call  Contact Status: Patient Contact Status: Complete  Language assistant needed: Interpreter Mode: Interpreter Not Needed        Follow-Up Questions: Do You Have Any Concerns About Your Health As You Heal From Delivery?: No Do You Have Any Concerns About Your Infants Health?: No  Edinburgh Postnatal Depression Scale:  In the Past 7 Days: I have been able to laugh and see the funny side of things.: Definitely not so much now I have looked forward with enjoyment to things.: As much as I ever did I have blamed myself unnecessarily when things went wrong.: No, never I have been anxious or worried for no good reason.: Hardly ever I have felt scared or panicky for no good reason.: No, not at all Things have been getting on top of me.: No, I have been coping as well as ever I have been so unhappy that I have had difficulty sleeping.: Not very often I have felt sad or miserable.: No, not at all I have been so unhappy that I have been crying.: No, never The thought of harming myself has occurred to me.: Never Edinburgh Postnatal Depression Scale Total: 4  PHQ2-9 Depression Scale:     Discharge Follow-up: Edinburgh score requires follow up?: No Patient was advised of the following resources:: Breastfeeding Support Group, Support Group  Post-discharge interventions: Reviewed Newborn Safe Sleep Practices  Steva Banter, RN 05/01/2024 [phone]

## 2024-05-20 ENCOUNTER — Ambulatory Visit: Admitting: Obstetrics

## 2024-05-20 ENCOUNTER — Encounter: Payer: Self-pay | Admitting: Obstetrics

## 2024-05-20 DIAGNOSIS — Z3009 Encounter for other general counseling and advice on contraception: Secondary | ICD-10-CM | POA: Diagnosis not present

## 2024-05-20 DIAGNOSIS — Z30011 Encounter for initial prescription of contraceptive pills: Secondary | ICD-10-CM | POA: Diagnosis not present

## 2024-05-20 DIAGNOSIS — D508 Other iron deficiency anemias: Secondary | ICD-10-CM | POA: Diagnosis not present

## 2024-05-20 DIAGNOSIS — Z3202 Encounter for pregnancy test, result negative: Secondary | ICD-10-CM

## 2024-05-20 LAB — POCT URINE PREGNANCY: Preg Test, Ur: NEGATIVE

## 2024-05-20 MED ORDER — NORETHINDRONE 0.35 MG PO TABS: 1.0000 | ORAL_TABLET | Freq: Every day | ORAL | 11 refills | Status: AC

## 2024-05-20 MED ORDER — ACCRUFER 30 MG PO CAPS: 1.0000 | ORAL_CAPSULE | Freq: Two times a day (BID) | ORAL | 3 refills | Status: AC

## 2024-05-20 NOTE — Progress Notes (Signed)
 "   Post Partum Visit Note  Teresa Johnson is a 28 y.o. G67P2002 female who presents for a postpartum visit. She is 4 weeks postpartum following a normal spontaneous vaginal delivery.  I have fully reviewed the prenatal and intrapartum course. The delivery was at 37 gestational weeks.  Anesthesia: epidural. Postpartum course has been good. Baby is doing well yes. Baby is feeding by both breast and bottle - Similac Advance. Bleeding pink. Bowel function is normal. Bladder function is normal. Patient is not sexually active. Contraception method is oral progesterone-only contraceptive. Postpartum depression screening: negative.   The pregnancy intention screening data noted above was reviewed. Potential methods of contraception were discussed. The patient elected to proceed with No data recorded.   Edinburgh Postnatal Depression Scale - 05/20/24 1550       Edinburgh Postnatal Depression Scale:  In the Past 7 Days   I have been able to laugh and see the funny side of things. 0    I have looked forward with enjoyment to things. 0    I have blamed myself unnecessarily when things went wrong. 0    I have been anxious or worried for no good reason. 0    I have felt scared or panicky for no good reason. 0    Things have been getting on top of me. 0    I have been so unhappy that I have had difficulty sleeping. 0    I have felt sad or miserable. 0    I have been so unhappy that I have been crying. 0    The thought of harming myself has occurred to me. 0    Edinburgh Postnatal Depression Scale Total 0          Health Maintenance Due  Topic Date Due   HPV VACCINES (1 - 3-dose series) Never done   Pneumococcal Vaccine (1 of 2 - PCV) Never done   Hepatitis B Vaccines 19-59 Average Risk (1 of 3 - 19+ 3-dose series) Never done   Influenza Vaccine  12/22/2023   COVID-19 Vaccine (3 - 2025-26 season) 01/22/2024    The following portions of the patient's history were reviewed and updated as  appropriate: allergies, current medications, past family history, past medical history, past social history, past surgical history, and problem list.  Review of Systems A comprehensive review of systems was negative.  Objective:  BP 97/62   Pulse 76   Ht 5' 1 (1.549 m)   Wt 121 lb 1.6 oz (54.9 kg)   LMP 07/27/2023 (Approximate)   Breastfeeding Yes   BMI 22.88 kg/m    General:  alert and no distress   Breasts:  normal  Lungs: clear to auscultation bilaterally  Heart:  regular rate and rhythm, S1, S2 normal, no murmur, click, rub or gallop  Abdomen: soft, non-tender; bowel sounds normal; no masses,  no organomegaly   Wound none  GU exam:  not indicated       Assessment:    1. Postpartum care following vaginal delivery (Primary) Rx: - POCT urine pregnancy  2. Iron deficiency anemia secondary to inadequate dietary iron intake Rx: - Ferric Maltol (ACCRUFER) 30 MG CAPS; Take 1 capsule (30 mg total) by mouth 2 (two) times daily before a meal. Take 2 hrs before, or 2 hrs after a meal.  Dispense: 60 capsule; Refill: 3  3. Encounter for other general counseling and advice on contraception - breast feeding.  Discussed contraceptive options.  Wants progestin-only pill  4.  Encounter for initial prescription of contraceptive pills  Rx: - - norethindrone (MICRONOR) 0.35 MG tablet; Take 1 tablet (0.35 mg total) by mouth daily.  Dispense: 28 tablet; Refill: 11   Plan:   Essential components of care per ACOG recommendations:  1.  Mood and well being: Patient with negative depression screening today. Reviewed local resources for support.  - Patient tobacco use? No.   - hx of drug use? No.    2. Infant care and feeding:  -Patient currently breastmilk feeding? Yes. Discussed returning to work and pumping. Reviewed importance of draining breast regularly to support lactation. Patient needs a work note. Patient was provided letter for work to allow for every 2-3 hr pumping breaks, and to  be granted a private location to express breastmilk and refrigerated area to store breastmilk.  -Social determinants of health (SDOH) reviewed in EPIC. No concerns  3. Sexuality, contraception and birth spacing - Patient does not want a pregnancy in the next year.  Desired family size is 3 children.  - Reviewed reproductive life planning. Reviewed contraceptive methods based on pt preferences and effectiveness.  Patient desired Oral Contraceptive today.   - Discussed birth spacing of 18 months  4. Sleep and fatigue -Encouraged family/partner/community support of 4 hrs of uninterrupted sleep to help with mood and fatigue  5. Physical Recovery  - Discussed patients delivery and complications. She describes her labor as good. - Patient had a Vaginal, no problems at delivery. Patient had no lacerations. Perineal healing reviewed. Patient expressed understanding - Patient has urinary incontinence? No. - Patient is safe to resume physical and sexual activity  6.  Health Maintenance - HM due items addressed Yes - Last pap smear  Diagnosis  Date Value Ref Range Status  03/17/2022   Final   - Negative for intraepithelial lesion or malignancy (NILM)   Pap smear not done at today's visit.  -Breast Cancer screening indicated? No.   7. Chronic Disease/Pregnancy Condition follow up: None   [facility], MD, [facility], [facility], [place] 05/20/2024  "

## 2024-05-20 NOTE — Progress Notes (Signed)
 Pt presents for pp. Pt wants the pill for bc. Pt is ready to go back to work as soon as possible. No questions or concerns at this time.

## 2024-06-05 ENCOUNTER — Ambulatory Visit: Admitting: Obstetrics and Gynecology

## 2024-07-01 ENCOUNTER — Ambulatory Visit: Payer: Self-pay | Admitting: Obstetrics and Gynecology
